# Patient Record
Sex: Female | Born: 1940 | Race: Black or African American | Hispanic: No | Smoking: Current every day smoker
Health system: Southern US, Community
[De-identification: ages and names within clinical notes are randomized; demographics above are authoritative.]

## PROBLEM LIST (undated history)

## (undated) DIAGNOSIS — F0390 Unspecified dementia without behavioral disturbance: Secondary | ICD-10-CM

## (undated) DIAGNOSIS — F32A Depression, unspecified: Secondary | ICD-10-CM

## (undated) DIAGNOSIS — R569 Unspecified convulsions: Secondary | ICD-10-CM

---

## 2005-11-17 ENCOUNTER — Other Ambulatory Visit: Payer: Self-pay

## 2005-11-17 ENCOUNTER — Emergency Department: Payer: Self-pay | Admitting: Unknown Physician Specialty

## 2006-02-09 ENCOUNTER — Ambulatory Visit: Payer: Self-pay | Admitting: Family Medicine

## 2006-03-06 ENCOUNTER — Ambulatory Visit: Payer: Self-pay | Admitting: Family Medicine

## 2007-08-30 ENCOUNTER — Ambulatory Visit: Payer: Self-pay | Admitting: Gastroenterology

## 2007-11-01 ENCOUNTER — Ambulatory Visit: Payer: Self-pay | Admitting: Family Medicine

## 2008-04-22 ENCOUNTER — Ambulatory Visit: Payer: Self-pay | Admitting: Neurology

## 2008-08-29 ENCOUNTER — Ambulatory Visit: Payer: Self-pay | Admitting: Internal Medicine

## 2009-02-02 ENCOUNTER — Ambulatory Visit: Payer: Self-pay | Admitting: Internal Medicine

## 2009-02-11 ENCOUNTER — Ambulatory Visit: Payer: Self-pay | Admitting: Internal Medicine

## 2010-04-01 ENCOUNTER — Ambulatory Visit: Payer: Self-pay | Admitting: Internal Medicine

## 2011-12-27 ENCOUNTER — Ambulatory Visit: Payer: Self-pay | Admitting: Family Medicine

## 2013-11-07 ENCOUNTER — Ambulatory Visit: Payer: Self-pay | Admitting: Family Medicine

## 2014-03-08 ENCOUNTER — Ambulatory Visit: Payer: Self-pay | Admitting: Internal Medicine

## 2014-03-25 ENCOUNTER — Inpatient Hospital Stay: Payer: Self-pay | Admitting: Internal Medicine

## 2014-03-25 LAB — BASIC METABOLIC PANEL
Anion Gap: 18 — ABNORMAL HIGH (ref 7–16)
BUN: 7 mg/dL (ref 7–18)
CHLORIDE: 106 mmol/L (ref 98–107)
CO2: 18 mmol/L — AB (ref 21–32)
CREATININE: 1.67 mg/dL — AB (ref 0.60–1.30)
Calcium, Total: 9.3 mg/dL (ref 8.5–10.1)
EGFR (Non-African Amer.): 30 — ABNORMAL LOW
GFR CALC AF AMER: 35 — AB
Glucose: 113 mg/dL — ABNORMAL HIGH (ref 65–99)
Osmolality: 282 (ref 275–301)
Potassium: 3.5 mmol/L (ref 3.5–5.1)
Sodium: 142 mmol/L (ref 136–145)

## 2014-03-25 LAB — URINALYSIS, COMPLETE
BILIRUBIN, UR: NEGATIVE
Bacteria: NONE SEEN
Glucose,UR: NEGATIVE mg/dL (ref 0–75)
Ketone: NEGATIVE
Leukocyte Esterase: NEGATIVE
Nitrite: NEGATIVE
PH: 6 (ref 4.5–8.0)
Protein: 30
RBC,UR: 3 /HPF (ref 0–5)
SQUAMOUS EPITHELIAL: NONE SEEN
Specific Gravity: 1.008 (ref 1.003–1.030)
WBC UR: 1 /HPF (ref 0–5)

## 2014-03-25 LAB — CBC
HCT: 36.8 % (ref 35.0–47.0)
HGB: 11.9 g/dL — AB (ref 12.0–16.0)
MCH: 32.5 pg (ref 26.0–34.0)
MCHC: 32.3 g/dL (ref 32.0–36.0)
MCV: 101 fL — ABNORMAL HIGH (ref 80–100)
PLATELETS: 194 10*3/uL (ref 150–440)
RBC: 3.66 10*6/uL — ABNORMAL LOW (ref 3.80–5.20)
RDW: 13.5 % (ref 11.5–14.5)
WBC: 10 10*3/uL (ref 3.6–11.0)

## 2014-03-26 ENCOUNTER — Ambulatory Visit: Payer: Self-pay | Admitting: Neurology

## 2014-03-26 LAB — BASIC METABOLIC PANEL
Anion Gap: 10 (ref 7–16)
BUN: 7 mg/dL (ref 7–18)
CALCIUM: 8.8 mg/dL (ref 8.5–10.1)
CHLORIDE: 110 mmol/L — AB (ref 98–107)
CREATININE: 1.2 mg/dL (ref 0.60–1.30)
Co2: 25 mmol/L (ref 21–32)
GFR CALC AF AMER: 52 — AB
GFR CALC NON AF AMER: 45 — AB
Glucose: 104 mg/dL — ABNORMAL HIGH (ref 65–99)
OSMOLALITY: 287 (ref 275–301)
POTASSIUM: 3.5 mmol/L (ref 3.5–5.1)
Sodium: 145 mmol/L (ref 136–145)

## 2014-03-26 LAB — CBC WITH DIFFERENTIAL/PLATELET
BASOS ABS: 0.1 10*3/uL (ref 0.0–0.1)
BASOS PCT: 0.6 %
Eosinophil #: 0.2 10*3/uL (ref 0.0–0.7)
Eosinophil %: 2.1 %
HCT: 34.6 % — ABNORMAL LOW (ref 35.0–47.0)
HGB: 11.8 g/dL — AB (ref 12.0–16.0)
LYMPHS ABS: 3.3 10*3/uL (ref 1.0–3.6)
Lymphocyte %: 36.3 %
MCH: 33.4 pg (ref 26.0–34.0)
MCHC: 34.2 g/dL (ref 32.0–36.0)
MCV: 98 fL (ref 80–100)
MONO ABS: 0.7 x10 3/mm (ref 0.2–0.9)
Monocyte %: 8.1 %
Neutrophil #: 4.8 10*3/uL (ref 1.4–6.5)
Neutrophil %: 52.9 %
Platelet: 203 10*3/uL (ref 150–440)
RBC: 3.54 10*6/uL — ABNORMAL LOW (ref 3.80–5.20)
RDW: 13 % (ref 11.5–14.5)
WBC: 9 10*3/uL (ref 3.6–11.0)

## 2014-03-26 LAB — FOLATE: Folic Acid: 52.6 ng/mL (ref 3.1–100.0)

## 2014-03-26 LAB — AMMONIA: Ammonia, Plasma: 12 mcmol/L (ref 11–32)

## 2014-03-26 LAB — CK: CK, TOTAL: 773 U/L — AB

## 2014-03-28 LAB — ALBUMIN: Albumin: 3.2 g/dL — ABNORMAL LOW (ref 3.4–5.0)

## 2014-03-28 LAB — PHENYTOIN LEVEL, TOTAL: Dilantin: 11 ug/mL (ref 10.0–20.0)

## 2014-04-08 ENCOUNTER — Ambulatory Visit: Payer: Self-pay | Admitting: Internal Medicine

## 2014-11-29 NOTE — Consult Note (Signed)
Psychiatry: Patient seen. Reviewed chart. Also spoke with someone at [facility] who knew the patient's baseline. That person described the patient as normally being able to answer yes/no questions and to walk and feed herself. Clearly demented but not usually agitated and often able to even make appropriate jokes. They had her as dx with dementia and a past history of depression of some sort. On exam today she is awake but will not engage with me. I tried to speak loudly, as I am told she is quite hard of hearing. Patient mostly ignoreed. Me. Nodded to a couple questions and then shouted "I have a headache" but wouldn't say any more. Would not say her name. She was not agitated ant showed no sign of hallucinating. current exam gives very little to go on. Cognitive function seems worse than reported baseline but whether this is volitional or result of brain dysfunction is unknown. She is not aggressive or dangerous or obviously delirious, so I wouldn't suggest adding antipsychotics or tranquikizers unless she gets agitated. Could be depressed but it is not obvious. There is little likely harm in incresing the zoloft to 100mg , so I will do that.  demented and may have reached a new, lower, baseline. Continue aricept and nemenda.  she can walk and feed herself and so is at the same functional levevl as before she can be discharged back to [facility]. If not, and she needs a higher level of care then SW can help with locating that.  not to be more specific, but I'd rather "do no harm" by not adding extra meds unless there is a clearly benifit to be gained.  follow. dementia of mixed eitiology, rule out delirium.  Electronic Signatures: Audery Amellapacs, Lariza Cothron T (MD)  (Signed on 21-Aug-15 14:23)  Authored  Last Updated: 21-Aug-15 14:23 by Audery Amellapacs, Coda Filler T (MD)

## 2014-11-29 NOTE — Consult Note (Signed)
Brief Consult Note: Diagnosis: delirium related to seizures in patient with dementia.   Patient was seen by consultant.   Recommend further assessment or treatment.   Comments: Psychiatry: Patient's chart reviewed. Came to see patient but she was sound asleep and did not arouse to repeatedly and loudly calling her name. I assume and appearance of psychosis can be attributed to ictal or post-ictal state or other delirium in a chronically demented patient. Will order low dose iv haldol and ativan together for agitation. Reevaluate if possible tomorrow.  Electronic Signatures: Audery Amellapacs, Cala Kruckenberg T (MD)  (Signed 20-Aug-15 16:31)  Authored: Brief Consult Note   Last Updated: 20-Aug-15 16:31 by Audery Amellapacs, Payam Gribble T (MD)

## 2014-11-29 NOTE — Consult Note (Signed)
Brief Consult Note: Diagnosis: Cognitive disorder NOS.   Patient was seen by consultant.   Recommend further assessment or treatment.   Comments: Ms. Michele Stout was disoriented and agitated earlier today. She received Ativan and has been fast asleep. Will try to assess tomorow.  Electronic Signatures: Kristine LineaPucilowska, Berkeley Veldman (MD)  (Signed 19-Aug-15 14:41)  Authored: Brief Consult Note   Last Updated: 19-Aug-15 14:41 by Kristine LineaPucilowska, Ameira Alessandrini (MD)

## 2014-11-29 NOTE — Consult Note (Signed)
Referring Physician:  Bettey Costa :   Primary Care Physician:  Marrianne Mood : [phone]  Reason for Consult: Admit Date: 26-Mar-2014  Chief Complaint: Witnessed seizures at group home  Reason for Consult: seizure   History of Present Illness: History of Present Illness:   Ms. Michele Stout is a 74 yo woman with a history of dementia and epilepsy (unknown history and semiology, on levetiracetam) who presented from her group home due to multiple witnessed convulsive events. The patient is a very poor historian and combative, so the history is gathered from the medical record. was apparently in her usual state of health at her group home (where she resides due to her dementia) when she was noted to have 3 separate episodes of bilateral convulsive activity that started with the right arm. EMS was called and she was given midazolam en route. She was admitted for further observation and workup of her seizures. Per RN staff, she has not had any further convulsive activity concerning for seizure since admission, though has remained lethargic and irritable.    ROS:  Review of Systems   Unable to obtain due to combativeness and altered mental status.  Past Medical/Surgical Hx:  seizure:   Past Medical/ Surgical Hx:  Past Medical History epilepsy dementia   Past Surgical History No known surgical history   Home Medications: Medication Instructions Last Modified Date/Time  alendronate 70 mg oral tablet 1 tab(s) orally once a week on Wednesday [id number] 74:25  folic acid 1 mg oral tablet 1 tab(s) orally once a day 18-Aug-15 18:40  Geritol Complete Therapeutic Multiple Vitamins with Minerals oral tablet 1 tab(s) orally once a day 18-Aug-15 18:40  omeprazole 20 mg oral delayed release capsule 1 cap(s) orally once a day 18-Aug-15 18:40  sertraline 50 mg oral tablet 1 tab(s) orally once a day 18-Aug-15 18:40  Calcium 600+D 600 mg-200 units oral tablet 1 tab(s) orally 2 times a day 18-Aug-15 18:40   levETIRAcetam 750 mg oral tablet 1 tab(s) orally 2 times a day 18-Aug-15 18:40  memantine 10 mg oral tablet 1 tab(s) orally 2 times a day 18-Aug-15 18:40  venlafaxine 75 mg oral tablet 1 tab(s) orally 2 times a day 18-Aug-15 18:40  donepezil 10 mg oral tablet 1 tab(s) orally once a day (at bedtime) 18-Aug-15 18:40  acetaminophen 500 mg oral tablet 2 tab(s) orally every 8 hours, As Needed - for Pain 18-Aug-15 18:40  cetirizine 10 mg oral tablet 1 tab(s) orally once a day, As Needed for allergies 18-Aug-15 18:40   Allergies:  No Known Allergies:   Social/Family History: Social History: Lives in group home. Unable to obtain remainder of social history.  Family History: Unable to obtain.   Vital Signs: **Vital Signs.:   19-Aug-15 12:24  Vital Signs Type Routine  Temperature Temperature (F) 98.6  Celsius 37  Temperature Source oral  Pulse Pulse 63  Respirations Respirations 18  Systolic BP Systolic BP 956  Diastolic BP (mmHg) Diastolic BP (mmHg) 65  Mean BP 84  Pulse Ox % Pulse Ox % 98  Pulse Ox Activity Level  At rest  Oxygen Delivery Room Air/ 21 %   EXAM: Extremely irritable upon arousal. No conjunctival injection or scleral edema. Oropharynx clear with tacky oral mucosae. No carotid bruits auscultated. Normal S1, S2 and regular cardiac rhythm on exam. Lungs clear to auscultation bilaterally. Abdomen soft and nontender. Peripheral pulses palpated. No clubbing, cyanosis, or edema in extremities.  MENTAL STATUS: Somnolent, though arousable for exam by touch. Extremely irritable  and frequently tries to bat me away. Mumbles incoherently though language seems to be focused on asking me to leave her alone. CRANIAL NERVES: Unable to examine visual fields as she forcefully maintains her eyes shut. PERRL. No obvious gaze deviation (limited by forced eye closure and noncompliance). Facial muscles grossly symmetric though difficult to examine fully as she refuses to leave a lateral decubitus  position.  MOTOR: Moves all 4 extremities against gravity without gross asymmetry though does not comply with formal strength testing REFLEXES: Extensor plantar responses bilaterally. SENSORY: Briskly localizes to pain in all four extremities. COORDINATION/GAIT: Unable to assess.  Lab Results:  Hepatic:  19-Aug-15 09:27   Albumin, Serum 3.6 (Result(s) reported on 26 Mar 2014 at 10:00AM.)  TDMs:  19-Aug-15 09:27   Dilantin, Serum  24.4 (Result(s) reported on 26 Mar 2014 at 10:03AM.)  Routine Chem:  19-Aug-15 04:20   Glucose, Serum  104  BUN 7  Creatinine (comp) 1.20  Sodium, Serum 145  Potassium, Serum 3.5  Chloride, Serum  110  CO2, Serum 25  Calcium (Total), Serum 8.8  Anion Gap 10  Osmolality (calc) 287  eGFR (African American)  52  eGFR (Non-African American)  45 (eGFR values <16mL/min/1.73 m2 may be an indication of chronic kidney disease (CKD). Calculated eGFR is useful in patients with stable renal function. The eGFR calculation will not be reliable in acutely ill patients when serum creatinine is changing rapidly. It is not useful in  patients on dialysis. The eGFR calculation may not be applicable to patients at the low and high extremes of body sizes, pregnant women, and vegetarians.)    95:28   Folic Acid, Serum 41.3 (Result(s) reported on 26 Mar 2014 at 10:47AM.)  Ammonia, Plasma 12 (Result(s) reported on 26 Mar 2014 at 10:06AM.)  Cardiac:  19-Aug-15 04:20   CK, Total  773 (26-192 NOTE: NEW REFERENCE RANGE  09/09/2013)  Routine UA:  18-Aug-15 18:10   Color (UA) Yellow  Clarity (UA) Clear  Glucose (UA) Negative  Bilirubin (UA) Negative  Ketones (UA) Negative  Specific Gravity (UA) 1.008  Blood (UA) 1+  pH (UA) 6.0  Protein (UA) 30 mg/dL  Nitrite (UA) Negative  Leukocyte Esterase (UA) Negative (Result(s) reported on 25 Mar 2014 at 07:37PM.)  RBC (UA) 3 /HPF  WBC (UA) 1 /HPF  Bacteria (UA) NONE SEEN  Epithelial Cells (UA) NONE SEEN  Mucous (UA)  PRESENT (Result(s) reported on 25 Mar 2014 at 07:37PM.)  Routine Hem:  19-Aug-15 04:20   WBC (CBC) 9.0  RBC (CBC)  3.54  Hemoglobin (CBC)  11.8  Hematocrit (CBC)  34.6  Platelet Count (CBC) 203  MCV 98  MCH 33.4  MCHC 34.2  RDW 13.0  Neutrophil % 52.9  Lymphocyte % 36.3  Monocyte % 8.1  Eosinophil % 2.1  Basophil % 0.6  Neutrophil # 4.8  Lymphocyte # 3.3  Monocyte # 0.7  Eosinophil # 0.2  Basophil # 0.1 (Result(s) reported on 26 Mar 2014 at 05:43AM.)   Radiology Impression: Radiology Impression: Labs and imaging personally reviewed.   Impression/Recommendations: Recommendations:   Ms. Michele Stout is a 74 yo woman with dementia and epilepsy who presents with a cluster of convulsive events concerning for seizure. Her baseline neurologic status and seizure frequency are not known. Currently, her exam is grossly nonfocal though she is somnolent and combatively irritable. Attempts to obtain a head CT have so far been unsuccessful.  etiology of her events is unclear. There is no clear infection to precipitate her seizures.  This may be breakthrough seizures superimposed upon her usual seizure frequency. A new stroke may also cause what seem to be focal-onset seizures by history. Continue levetiracetam. Would not increase dose as this may lead to worsened irritability in an already profoundly irritable patientEEG to rule out nonconvulsive seizure activity as contributor to her poor mental status (severity of her baseline dementia is unknown)Discontinue phenytoin and start Depakote 262m BID insteadHead CT without contrast to evaluate for infarction when/if feasible from behavioral standpoint you for the opportunity to participate in Ms. Menter's care. I will continue to follow along with you. HMar Daring MD   Electronic Signatures: HCarmin Richmond(MD)  (Signed 19-Aug-15 15:04)  Authored: REFERRING PHYSICIAN, Primary Care Physician, Consult, History of Present Illness, Review of Systems, PAST  MEDICAL/SURGICAL HISTORY, HOME MEDICATIONS, ALLERGIES, Social/Family History, NURSING VITAL SIGNS, Physical Exam-, LAB RESULTS, RADIOLOGY RESULTS, Recommendations   Last Updated: 19-Aug-15 15:04 by HCarmin Richmond(MD)

## 2014-11-29 NOTE — Discharge Summary (Signed)
PATIENT NAME:  Michele Stout, Michele Stout MR#:  [id number] DATE OF BIRTH:  06/07/1941  DISCHARGE DIAGNOSES: 1.  Generalized tonic-clonic seizures.  2.  Advanced dementia.  3.  Acute renal failure.  4.  Mild rhabdomyolysis.  5.  Gastroesophageal reflux disease.   CONSULTS: [facility]. Jeryl Columbia, MD, with neurology; Dr. Harvie Junior  with palliative care; Audery Amel, MD, with psychiatry.   IMAGING STUDIES DONE: Include a CT scan of the head without contrast. It showed mild diffuse atrophy, some concern with possible left frontal lobe subarachnoid hemorrhage.  A repeat CT scan in 24 hours showed no subarachnoid hemorrhage.   ADMITTING HISTORY AND PHYSICAL: Please see detailed H and P dictated by Dr. Juliene Pina. In brief, a 74 year old female patient with history of advanced dementia, recurrent seizures, who lives at a group home, presented to the hospital after she was found to have generalized tonic-clonic seizures. The patient also had postictal confusion, admitted to hospitalist service.   HOSPITAL COURSE: 1.  Generalized tonic-clonic seizures. The patient has been on Keppra for a long time which was continued. Initially, started on Dilantin, but later switched to Depakote after discussing with neurology. A CT scan of the head was done which showed mild atrophy. No strokes, but there was some concern for a possible left frontal area subarachnoid hemorrhage, but repeat CT did not show any subarachnoid hemorrhage. I did discuss with Dr. Yetta Barre of neurosurgery initially at  [facility] for a transfer, but he had advised to monitor the patient here at [facility] with which she has done well, and no anticoagulations were given. The patient has been seizure free during the hospital stay. An EEG could not be done as the patient was agitated initially, but she returned to her baseline and there is no concern for any nonconvulsive status epilepticus at this point.  2.  Acute encephalopathy. The patient does have advanced dementia  and with low brain reserve, she did have prolonged confusion after her 3 seizures. The patient by the day of discharge has returned to normal. Initially was started on Seroquel, but this has been stopped after discussing with Dr. Toni Amend, who has seen her. She does seem to have worsening dementia.   The patient worked with physical therapy. She has ambulated well, has returned to normal, and will be discharged back to her group home. If patient has any further confusion or worsening dementia, she might be a candidate to be admitted to a memory care unit.   DISCHARGE MEDICATIONS: 1.  Acetaminophen 500 mg 2 tablets every 8 hours as needed.  2.  Alendronate 70 mg weekly on Wednesdays.  3.  Depakote 250 mg oral b.i.d.  4.  Keppra 750 mg oral b.i.d.  5.  Calcium, vitamin D daily.  7.  Donepezil 10 mg daily. 8.  Folic acid 1 mg daily.  9.  Memantine 10 mg oral 2 times a day.  10.  Omeprazole 20 mg daily.  11.  Sertraline 50 mg oral once a day.  12.  Venlafaxine 75 mg oral 2 times a day.   DISCHARGE INSTRUCTIONS: Regular diet, regular activity, using a walker. Follow up with primary care physician in 1 to 2 weeks and neurology, Dr. Sherryll Burger or Dr. Malvin Johns, in 2 to 4 weeks.   Time Spent on day of discharge in discharge activity was 42 minutes.   ____________________________ Molinda Bailiff Tam Savoia, MD srs:LT D: 03/30/2014 12:08:00 ET T: 03/30/2014 17:59:44 ET JOB#: [id number]  cc: Wardell Heath R. Aryonna Gunnerson, MD, <Dictator> [id number] R  Carnelius Hammitt MD ELECTRONICALLY SIGNED 04/22/2014 16:30

## 2014-11-29 NOTE — H&P (Signed)
PATIENT NAME:  Michele Stout, Michele Stout MR#:  [id number] DATE OF BIRTH:  December 01, 1940  DATE OF ADMISSION:  03/25/2014  PRIMARY CARE PHYSICIAN: Pilar Grammes, MD   CHIEF COMPLAINT: Seizure.   HISTORY OF PRESENT ILLNESS: A 74 year old female with a history of seizure disorder, dementia, who presents from her group home with seizures. Apparently, she had 3 witnessed seizures today at the group home. These started out with the right extremity and then seemed to be evolving to generalized tonic-clonic seizures. EMS was called after her third seizure. She was brought in the EMS truck, received 2 mg of Versed and since then has been groggy. She is unable to obtain a CT scan of the head which was ordered by the ER physician, as the patient  cannot lie still and flat.   REVIEW OF SYSTEMS: Unable to obtain as the patient is lethargic.   PAST MEDICAL HISTORY: As per the medical chart: 1.  Seizure disorder.  2.  Dementia.  3.  GERD. 4.  History of alcohol abuse in the past.  5.  Osteoporosis.  SOCIAL HISTORY: Unable to obtain.  FAMILY HISTORY: Unable to obtain.   PAST SURGICAL HISTORY: Unable to obtain.   MEDICATIONS:  1.  Acetaminophen 500 mg 2 tablets q. 8 hours p.r.n.  2.  Alendronate 70 mg weekly on Wednesday.  3.  Calcium plus D 1 tablet b.i.d.  4.  Cetirizine 10 mg p.r.n.  5.  Donepezil 10 mg at bedtime.  6.  Folic acid 1 mg daily.  7.  Geritol Complete vitamin daily.  8.  Keppra 750 b.i.d.  9.  Memantine 10 mg b.i.d.  10.  Omeprazole 20 mg daily. 11.  Zoloft 50 mg daily.  12.  Effexor 75 mg b.i.d.   ALLERGIES: No known drug allergies.   PHYSICAL EXAMINATION:  VITAL SIGNS: Temperature 99, respirations 22, blood pressure 128/56 and 95% on room air.  GENERAL: The patient is lethargic in a fetal position. She has her covers over her head. When I take the covers off, she actually does move all extremities and kind of wakes up but then puts the covers right back on her head.   HEENT: Head is  atraumatic. Pupils are about 2 mm, sluggish to light. Oropharynx is not inspected as it was difficult to do an oral exam.  NECK: Supple without JVD, carotid bruit, enlarged thyroid. CARDIOVASCULAR: Regular rate and rhythm. No murmurs, gallops or rubs. PMI is not displaced.  LUNGS: Clear to auscultation without crackles, rales, rhonchi or wheezing. Normal to percussion.  ABDOMEN: Bowel sounds are positive. Nontender, nondistended. No hepatosplenomegaly.  EXTREMITIES: No clubbing, cyanosis or edema.  NEUROLOGIC: Hard to do a good neurological exam, but her Babinski sign is downgoing.  SKIN: Without rash or lesions.   LABORATORY DATA: Sodium 142, potassium 3.5, chloride 106, bicarbonate 18, BUN 7, creatinine 1.67, glucose 113, calcium 9.3. Urinalysis is negative for LC or nitrites. White blood cells 10, hemoglobin 12, hematocrit 37, platelets are 194,000.   EKG: Normal sinus rhythm. No ST elevation or depression.   ASSESSMENT AND PLAN: A 74 year old female with dementia, seizure disorder, who presents with 3 seizures.  1.  Seizures. The patient apparently had 3 seizures today which were localized and then went into general tonic-clonic seizures. The patient was given 2 mg of Versed by EMS and now is postictal on top of probably some dementia. I have ordered a neurological consult, neurological checks q. 4 hours, EEG. We will try to obtain a CT of  the head. I have placed her on Dilantin 100 mg IV q. 8 hours after fosphenytoin loading which the pharmacy will assist with.  Hold p.o. medications for now.  2.  Dementia. Once she is able to take in p.o. she may resume her p.o. medications.  3.  Acute kidney injury, likely secondary to these seizures with possible component of acute tubular necrosis. We will provide IV fluids. Repeat a BMP in the a.m.   CODE STATUS: The patient is a full code status.  Case management has been consulted for discharge planning.   TIME SPENT: Approximately 40  minutes.   ____________________________ Janyth ContesSital P. Juliene PinaMody, MD spm:TT D: 03/25/2014 20:05:59 ET T: 03/25/2014 20:48:09 ET JOB#: [id number]  cc: Tammy Wickliffe P. Juliene PinaMody, MD, <Dictator> Janyth ContesSITAL P Zakary Kimura MD ELECTRONICALLY SIGNED 03/26/2014 13:49

## 2014-12-24 ENCOUNTER — Other Ambulatory Visit: Payer: Self-pay | Admitting: Internal Medicine

## 2014-12-24 DIAGNOSIS — Z1231 Encounter for screening mammogram for malignant neoplasm of breast: Secondary | ICD-10-CM

## 2015-01-08 ENCOUNTER — Other Ambulatory Visit: Payer: Self-pay | Admitting: Internal Medicine

## 2015-01-08 ENCOUNTER — Ambulatory Visit
Admission: RE | Admit: 2015-01-08 | Discharge: 2015-01-08 | Disposition: A | Discharge status: Home / Self Care | Payer: Medicare Other | Source: Ambulatory Visit | Attending: Internal Medicine | Admitting: Internal Medicine

## 2015-01-08 DIAGNOSIS — R922 Inconclusive mammogram: Secondary | ICD-10-CM | POA: Diagnosis not present

## 2015-01-08 DIAGNOSIS — Z1231 Encounter for screening mammogram for malignant neoplasm of breast: Secondary | ICD-10-CM

## 2015-01-09 ENCOUNTER — Other Ambulatory Visit: Payer: Self-pay | Admitting: Internal Medicine

## 2015-01-15 ENCOUNTER — Other Ambulatory Visit: Payer: Self-pay | Admitting: Internal Medicine

## 2015-01-15 DIAGNOSIS — E2839 Other primary ovarian failure: Secondary | ICD-10-CM

## 2015-01-16 ENCOUNTER — Other Ambulatory Visit: Payer: Self-pay | Admitting: Internal Medicine

## 2015-01-16 DIAGNOSIS — R928 Other abnormal and inconclusive findings on diagnostic imaging of breast: Secondary | ICD-10-CM

## 2015-01-26 ENCOUNTER — Ambulatory Visit
Admission: RE | Admit: 2015-01-26 | Discharge: 2015-01-26 | Disposition: A | Discharge status: Home / Self Care | Payer: Medicare Other | Source: Ambulatory Visit | Attending: Internal Medicine | Admitting: Internal Medicine

## 2015-01-26 DIAGNOSIS — R928 Other abnormal and inconclusive findings on diagnostic imaging of breast: Secondary | ICD-10-CM | POA: Diagnosis present

## 2015-01-26 DIAGNOSIS — M8580 Other specified disorders of bone density and structure, unspecified site: Secondary | ICD-10-CM | POA: Diagnosis not present

## 2015-01-26 DIAGNOSIS — E2839 Other primary ovarian failure: Secondary | ICD-10-CM

## 2015-02-18 ENCOUNTER — Emergency Department
Admission: EM | Admit: 2015-02-18 | Discharge: 2015-02-18 | Disposition: A | Discharge status: Home / Self Care | Payer: Medicare Other | Attending: Emergency Medicine | Admitting: Emergency Medicine

## 2015-02-18 ENCOUNTER — Emergency Department: Payer: Medicare Other

## 2015-02-18 DIAGNOSIS — M25552 Pain in left hip: Secondary | ICD-10-CM | POA: Diagnosis present

## 2015-02-18 NOTE — ED Provider Notes (Signed)
[facility] Emergency Department Provider Note    ____________________________________________  Time seen: On EMS arrival  I have reviewed the triage vital signs and the nursing notes.   HISTORY  Chief Complaint No chief complaint on file.   History limited by: Dementia   HPI Michele Stout is a 74 y.o. female who presents from assisted living facility today because of concerns for left hip pain. Staff told EMS that the patient started complaining of left hip pain today. No known falls. The patient is at her mental baseline according to EMS staff. The patient herself is not complaining of any pain when asked. However the patient is also not very verbal.     No past medical history on file.  There are no active problems to display for this patient.   No past surgical history on file.  No current outpatient prescriptions on file.  Allergies Review of patient's allergies indicates not on file.  No family history on file.  Social History History  Substance Use Topics  . Smoking status: Not on file  . Smokeless tobacco: Not on file  . Alcohol Use: Not on file   lives at assisted living facility  Review of Systems Unable to obtain secondary to dementia, nonverbal. ____________________________________________   PHYSICAL EXAM:  VITAL SIGNS:   98.6 F (37 C)   56  17  126/89 mmHg  97 %   Constitutional: Awake and alert, in no acute distress. Eyes: Conjunctivae are normal. PERRL. Normal extraocular movements. ENT   Head: Normocephalic and atraumatic.   Nose: No congestion/rhinnorhea.   Mouth/Throat: Mucous membranes are moist.   Neck: No stridor. Hematological/Lymphatic/Immunilogical: No cervical lymphadenopathy. Cardiovascular: Normal rate, regular rhythm.  No murmurs, rubs, or gallops. Respiratory: Normal respiratory effort without tachypnea nor retractions. Breath sounds are clear and equal bilaterally. No  wheezes/rales/rhonchi. Gastrointestinal: Soft and nontender. No distention. There is no CVA tenderness. Genitourinary: Deferred Musculoskeletal: Normal range of motion in all extremities. No joint effusions.  Some tenderness over the left hip. No shortening. No rotation. No tenderness on rotation at the hip or flexion of the hip. Neurologic:  Awake and alert, nonverbal Skin:  Skin is warm, dry and intact. No rash noted.   ____________________________________________    LABS (pertinent positives/negatives)  None  ____________________________________________   EKG  None  ____________________________________________    RADIOLOGY  Left hip x-ray IMPRESSION: Slight symmetric narrowing of both hip joints. No fracture or dislocation.  ____________________________________________   PROCEDURES  Procedure(s) performed: None  Critical Care performed: No  ____________________________________________   INITIAL IMPRESSION / ASSESSMENT AND PLAN / ED COURSE  Pertinent labs & imaging results that were available during my care of the patient were reviewed by me and considered in my medical decision making (see chart for details).  Patient here with concerns for left hip pain. No known trauma. X-ray without obvious hip fracture. Will discharge back to living facility.  ____________________________________________   FINAL CLINICAL IMPRESSION(S) / ED DIAGNOSES  Final diagnoses:  Hip pain, left     Phineas SemenGraydon Corinthian Mizrahi, MD 02/18/15 1504

## 2015-02-18 NOTE — Discharge Instructions (Signed)
Please seek medical attention for any high fevers, chest pain, shortness of breath, change in behavior, persistent vomiting, bloody stool or any other new or concerning symptoms. °Arthritis, Nonspecific °Arthritis is inflammation of a joint. This usually means pain, redness, warmth or swelling are present. One or more joints may be involved. There are a number of types of arthritis. Your caregiver may not be able to tell what type of arthritis you have right away. °CAUSES  °The most common cause of arthritis is the wear and tear on the joint (osteoarthritis). This causes damage to the cartilage, which can break down over time. The knees, hips, back and neck are most often affected by this type of arthritis. °Other types of arthritis and common causes of joint pain include: °· Sprains and other injuries near the joint. Sometimes minor sprains and injuries cause pain and swelling that develop hours later. °· Rheumatoid arthritis. This affects hands, feet and knees. It usually affects both sides of your body at the same time. It is often associated with chronic ailments, fever, weight loss and general weakness. °· Crystal arthritis. Gout and pseudo gout can cause occasional acute severe pain, redness and swelling in the foot, ankle, or knee. °· Infectious arthritis. Bacteria can get into a joint through a break in overlying skin. This can cause infection of the joint. Bacteria and viruses can also spread through the blood and affect your joints. °· Drug, infectious and allergy reactions. Sometimes joints can become mildly painful and slightly swollen with these types of illnesses. °SYMPTOMS  °· Pain is the main symptom. °· Your joint or joints can also be red, swollen and warm or hot to the touch. °· You may have a fever with certain types of arthritis, or even feel overall ill. °· The joint with arthritis will hurt with movement. Stiffness is present with some types of arthritis. °DIAGNOSIS  °Your caregiver will  suspect arthritis based on your description of your symptoms and on your exam. Testing may be needed to find the type of arthritis: °· Blood and sometimes urine tests. °· X-ray tests and sometimes CT or MRI scans. °· Removal of fluid from the joint (arthrocentesis) is done to check for bacteria, crystals or other causes. Your caregiver (or a specialist) will numb the area over the joint with a local anesthetic, and use a needle to remove joint fluid for examination. This procedure is only minimally uncomfortable. °· Even with these tests, your caregiver may not be able to tell what kind of arthritis you have. Consultation with a specialist (rheumatologist) may be helpful. °TREATMENT  °Your caregiver will discuss with you treatment specific to your type of arthritis. If the specific type cannot be determined, then the following general recommendations may apply. °Treatment of severe joint pain includes: °· Rest. °· Elevation. °· Anti-inflammatory medication (for example, ibuprofen) may be prescribed. Avoiding activities that cause increased pain. °· Only take over-the-counter or prescription medicines for pain and discomfort as recommended by your caregiver. °· Cold packs over an inflamed joint may be used for 10 to 15 minutes every hour. Hot packs sometimes feel better, but do not use overnight. Do not use hot packs if you are diabetic without your caregiver's permission. °· A cortisone shot into arthritic joints may help reduce pain and swelling. °· Any acute arthritis that gets worse over the next 1 to 2 days needs to be looked at to be sure there is no joint infection. °Long-term arthritis treatment involves modifying activities and lifestyle   to reduce joint stress jarring. This can include weight loss. Also, exercise is needed to nourish the joint cartilage and remove waste. This helps keep the muscles around the joint strong. °HOME CARE INSTRUCTIONS  °· Do not take aspirin to relieve pain if gout is suspected.  This elevates uric acid levels. °· Only take over-the-counter or prescription medicines for pain, discomfort or fever as directed by your caregiver. °· Rest the joint as much as possible. °· If your joint is swollen, keep it elevated. °· Use crutches if the painful joint is in your leg. °· Drinking plenty of fluids may help for certain types of arthritis. °· Follow your caregiver's dietary instructions. °· Try low-impact exercise such as: °¨ Swimming. °¨ Water aerobics. °¨ Biking. °¨ Walking. °· Morning stiffness is often relieved by a warm shower. °· Put your joints through regular range-of-motion. °SEEK MEDICAL CARE IF:  °· You do not feel better in 24 hours or are getting worse. °· You have side effects to medications, or are not getting better with treatment. °SEEK IMMEDIATE MEDICAL CARE IF:  °· You have a fever. °· You develop severe joint pain, swelling or redness. °· Many joints are involved and become painful and swollen. °· There is severe back pain and/or leg weakness. °· You have loss of bowel or bladder control. °Document Released: 09/01/2004 Document Revised: 10/17/2011 Document Reviewed: 09/17/2008 °[employer]® Patient Information ©2015 [employer]. This information is not intended to replace advice given to you by your health care provider. Make sure you discuss any questions you have with your health care provider. ° °

## 2015-02-18 NOTE — ED Notes (Signed)
Patient arrives via [facility] with c/o left hip pain. No trauma associated with pain per staff of [facility].

## 2015-02-18 NOTE — ED Notes (Signed)
Report called to [facility] Patient stable at time of TX. [facility] arrival to transport patient

## 2015-08-03 IMAGING — CR DG HIP (WITH OR WITHOUT PELVIS) 2-3V*L*
3 series · 3 of 3 positions shown · non-contrast
Comparison: None.

CLINICAL DATA: Left hip pain.  No history of recent trauma

EXAM:
DG HIP (WITH OR WITHOUT PELVIS) 2-3V LEFT

[pelvis ap]
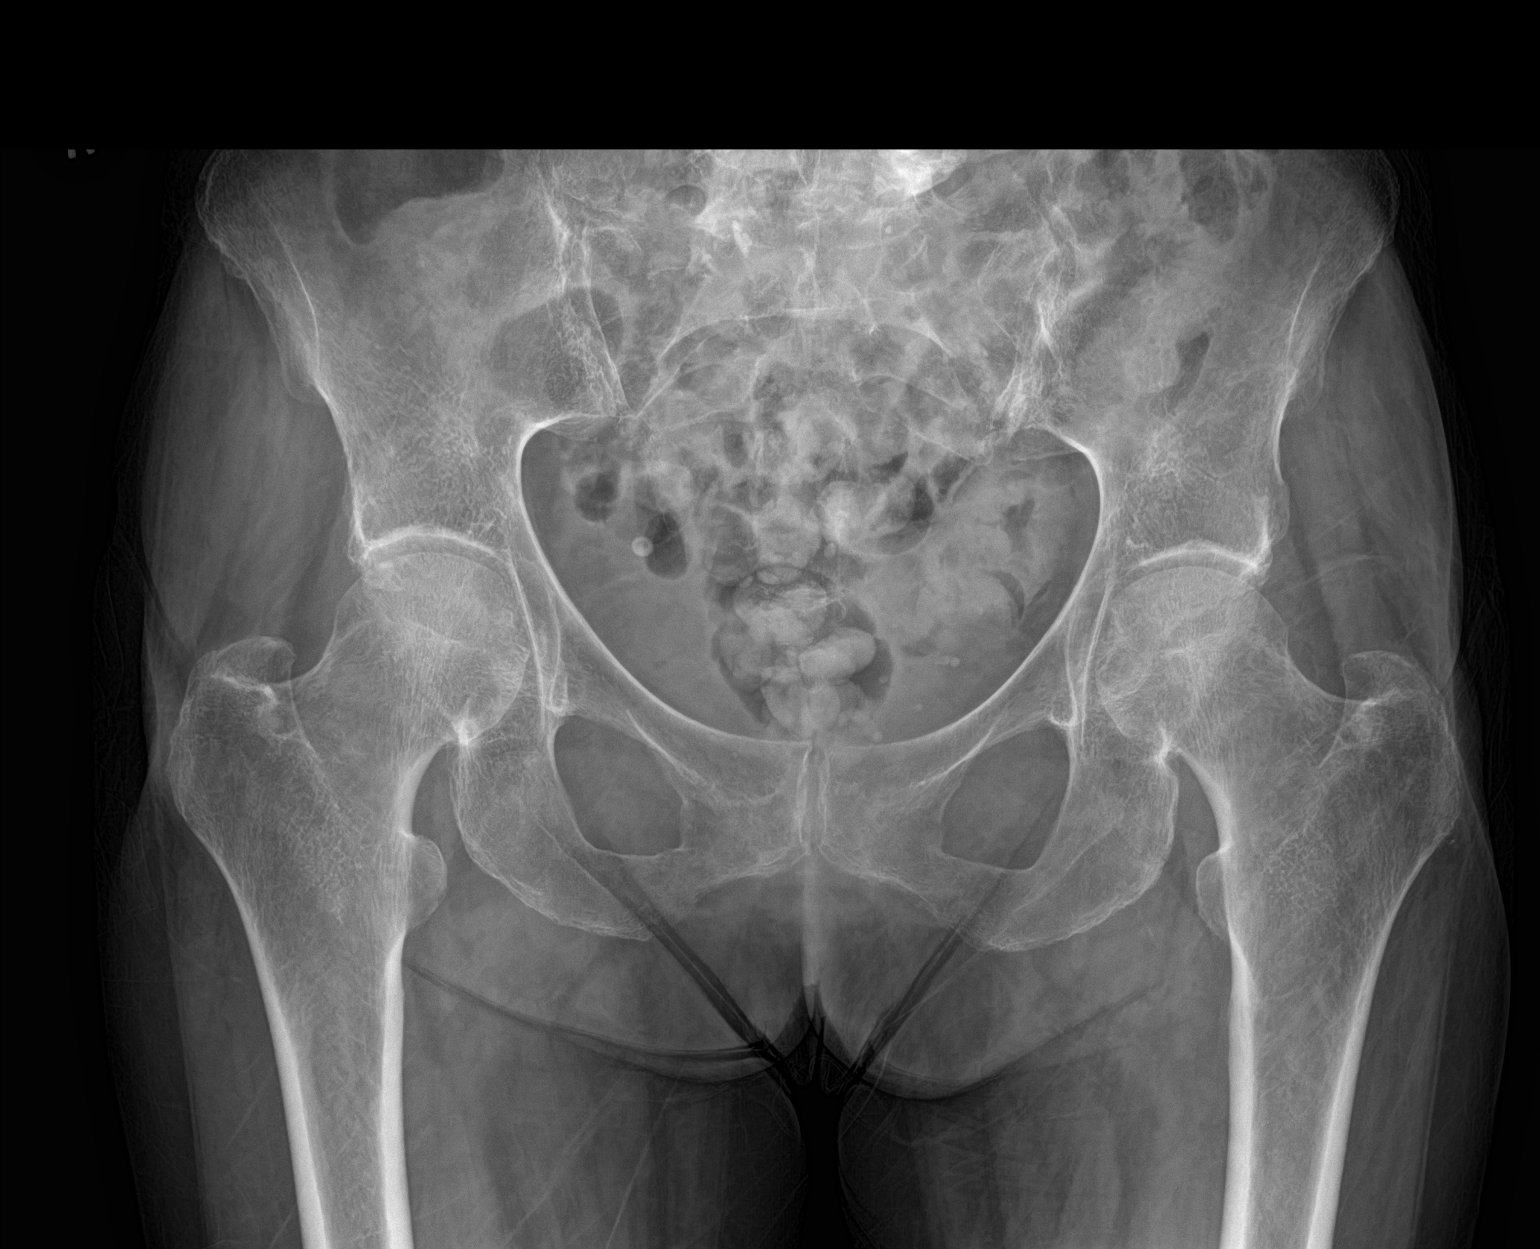

[hip ap]
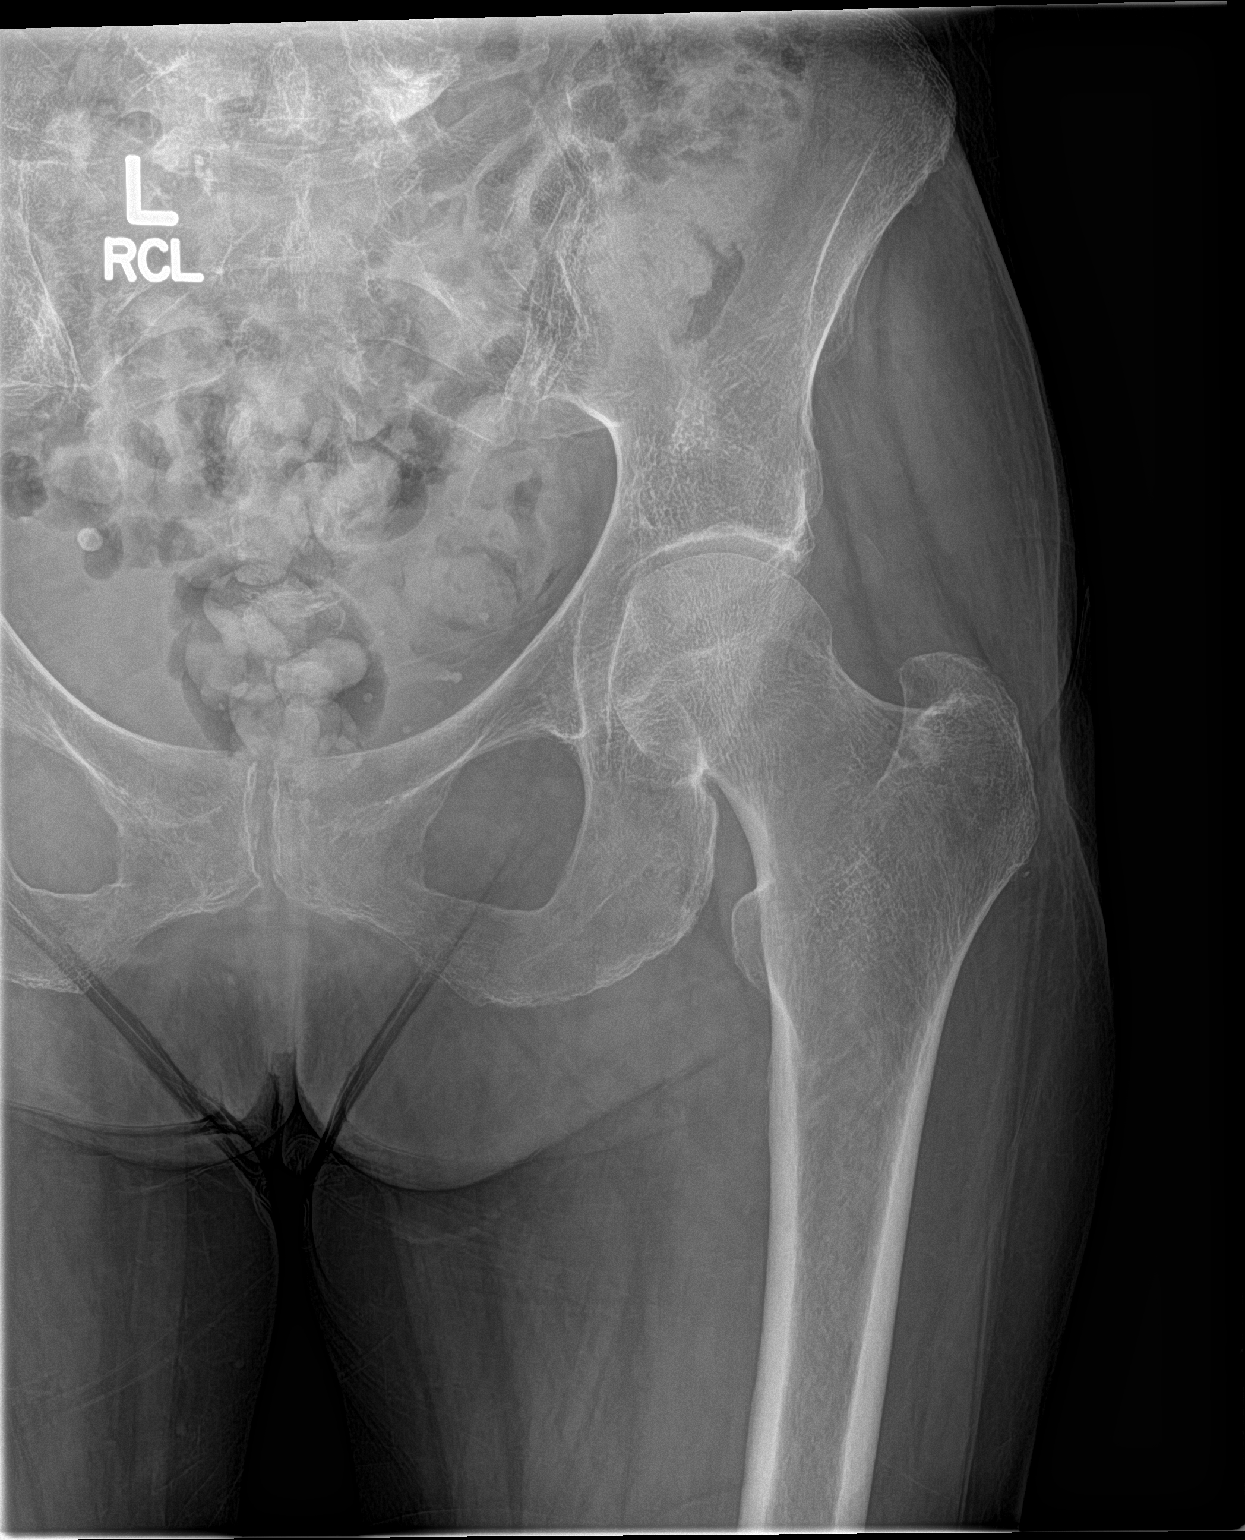

[hip lat]
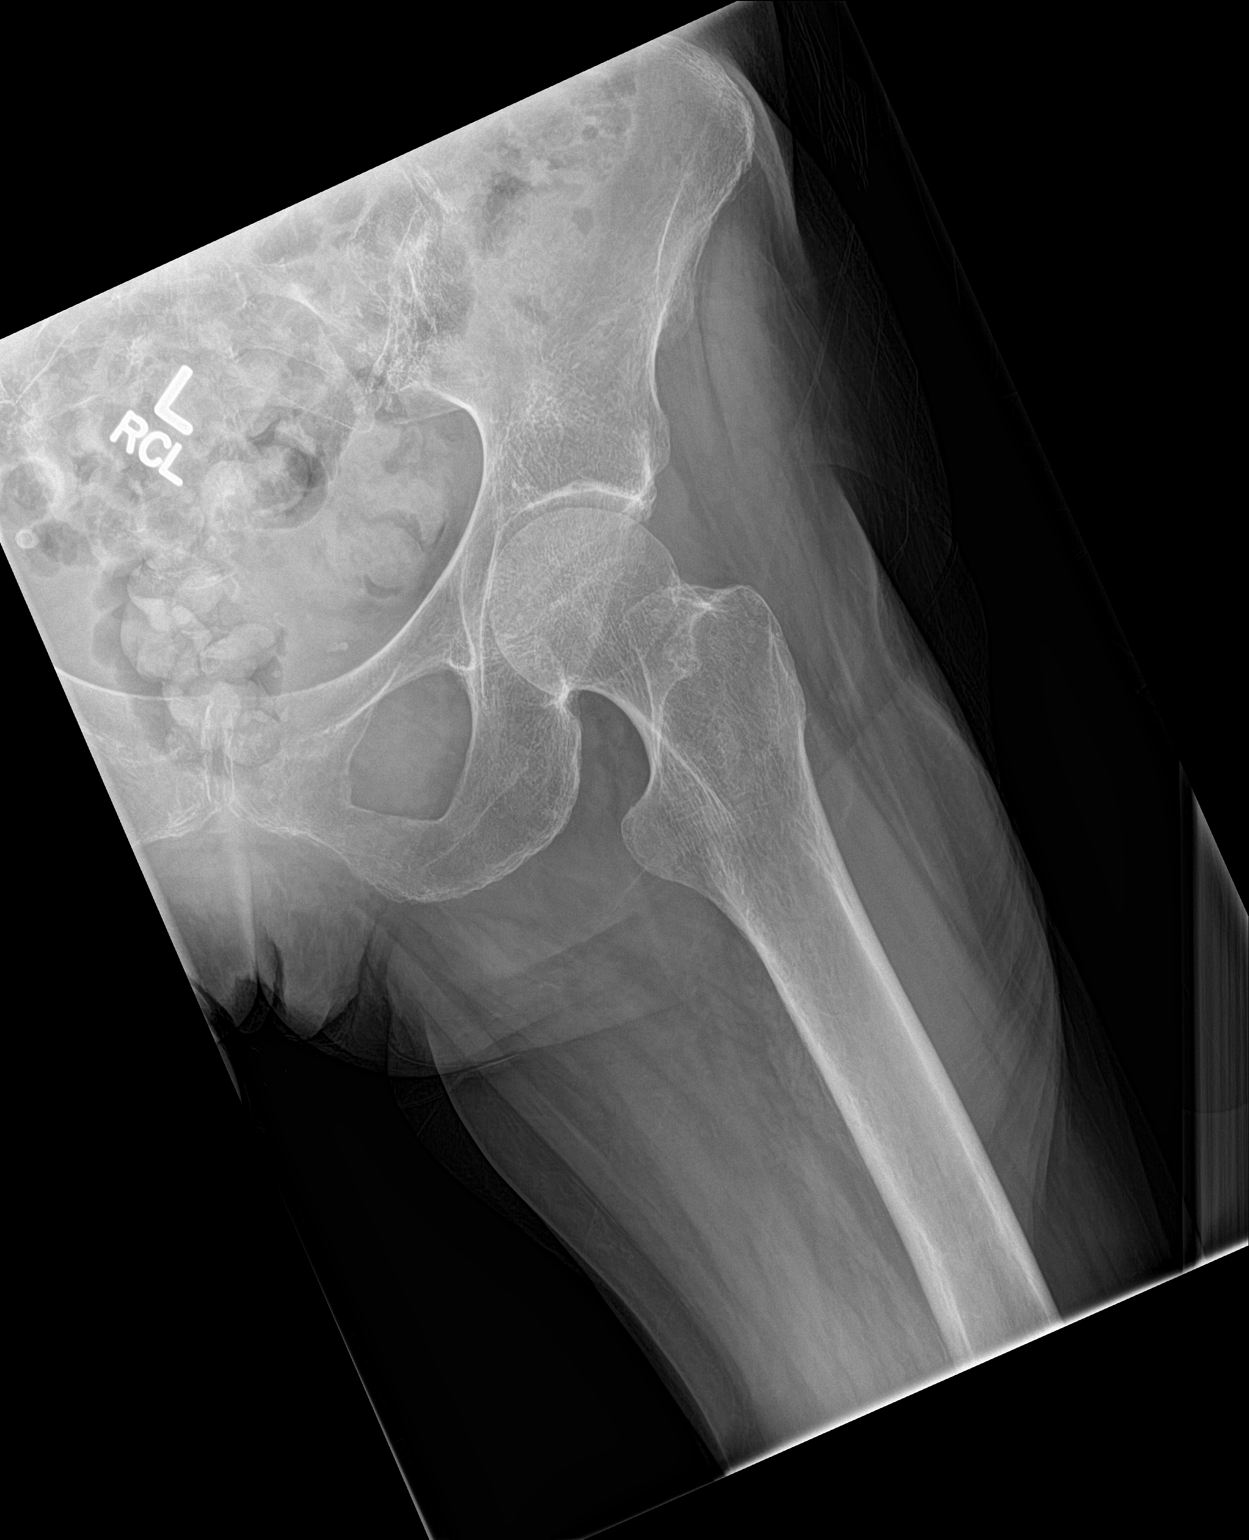

[3 of 3 positions shown; findings below may reference images not displayed]

FINDINGS: Frontal pelvis as well as frontal and lateral left hip images were
obtained. There is no fracture or dislocation. There is slight
symmetric narrowing of both hip joints. No erosive change.
IMPRESSION: Slight symmetric narrowing of both hip joints. No fracture or
dislocation.

## 2019-12-29 ENCOUNTER — Emergency Department
Admission: EM | Admit: 2019-12-29 | Discharge: 2019-12-30 | Disposition: A | Discharge status: Home / Self Care | Payer: Medicare Other | Attending: Emergency Medicine | Admitting: Emergency Medicine

## 2019-12-29 ENCOUNTER — Other Ambulatory Visit: Payer: Self-pay

## 2019-12-29 ENCOUNTER — Emergency Department: Payer: Medicare Other

## 2019-12-29 DIAGNOSIS — Y939 Activity, unspecified: Secondary | ICD-10-CM | POA: Insufficient documentation

## 2019-12-29 DIAGNOSIS — W010XXA Fall on same level from slipping, tripping and stumbling without subsequent striking against object, initial encounter: Secondary | ICD-10-CM | POA: Insufficient documentation

## 2019-12-29 DIAGNOSIS — Y92129 Unspecified place in nursing home as the place of occurrence of the external cause: Secondary | ICD-10-CM | POA: Diagnosis not present

## 2019-12-29 DIAGNOSIS — M545 Low back pain, unspecified: Secondary | ICD-10-CM

## 2019-12-29 DIAGNOSIS — Y999 Unspecified external cause status: Secondary | ICD-10-CM | POA: Diagnosis not present

## 2019-12-29 DIAGNOSIS — W19XXXA Unspecified fall, initial encounter: Secondary | ICD-10-CM

## 2019-12-29 MED ORDER — ACETAMINOPHEN 325 MG PO TABS: 650.0000 mg | ORAL_TABLET | Freq: Once | ORAL | Status: DC

## 2019-12-29 NOTE — ED Notes (Signed)
Pt to xray

## 2019-12-29 NOTE — ED Triage Notes (Signed)
Ems states pt from [facility]. Per ems pt slipped on floor and fell. Pt with history of dementia, just moans when touched. Per ems pt with tenderness on palpation of back. No drainage noted from ears or nose. Pupils 35mm.

## 2019-12-29 NOTE — ED Provider Notes (Signed)
[facility] Emergency Department Provider Note   ____________________________________________   First MD Initiated Contact with Patient 12/29/19 2310     (approximate)  I have reviewed the triage vital signs and the nursing notes.   HISTORY  Chief Complaint Fall  Level V caveat: Limited by dementia  HPI Michele Stout is a 79 y.o. female brought to the ED via EMS from assisted living facility status post fall.  EMS reports staff witnessed patient slipping on wet floor and fell.  Denies LOC.  Patient with a history of dementia who moans when her back is touched.  Rest of history is limited secondary to dementia.     Past medical history Dementia GERD Seizures  There are no problems to display for this patient.   Prior to Admission medications   Medication Sig Start Date End Date Taking? Authorizing Provider  acetaminophen (TYLENOL) 500 MG tablet Take 2 tablets by mouth every 8 (eight) hours as needed for mild pain.     [provider]  alendronate (FOSAMAX) 70 MG tablet Take 1 tablet by mouth every 7 (seven) days. On Wednesday    [provider]  Calcium Carbonate-Vitamin D (CALCIUM 600+D) 600-400 MG-UNIT per tablet Take 1 tablet by mouth 2 (two) times daily.    [provider]  cetirizine (ZYRTEC) 10 MG tablet Take 1 tablet by mouth daily as needed for allergies.     [provider]  donepezil (ARICEPT) 10 MG tablet Take 1 tablet by mouth at bedtime.    [provider]  folic acid (FOLVITE) 1 MG tablet Take 1 tablet by mouth daily.    [provider]  Iron-Vitamins (GERITOL COMPLETE) TABS Take 1 tablet by mouth daily.    [provider]  levETIRAcetam (KEPPRA) 1000 MG tablet Take 1 tablet by mouth 2 (two) times daily.    [provider]  memantine (NAMENDA) 10 MG tablet Take 1 tablet by mouth 2 (two) times daily.    [provider]  omeprazole (PRILOSEC) 20 MG capsule  Take 1 capsule by mouth daily.    [provider]  sertraline (ZOLOFT) 50 MG tablet Take 1 tablet by mouth daily.    [provider]  venlafaxine (EFFEXOR) 75 MG tablet Take 1 tablet by mouth 2 (two) times daily.    [provider]    Allergies Aspirin  No family history on file.  Social History Social History   Tobacco Use   Smoking status: Not on file  Substance Use Topics   Alcohol use: Not on file   Drug use: Not on file    Review of Systems  Constitutional: No fever/chills Eyes: No visual changes. ENT: No sore throat. Cardiovascular: Denies chest pain. Respiratory: Denies shortness of breath. Gastrointestinal: No abdominal pain.  No nausea, no vomiting.  No diarrhea.  No constipation. Genitourinary: Negative for dysuria. Musculoskeletal: Positive for back pain. Skin: Negative for rash. Neurological: Negative for headaches, focal weakness or numbness.   ____________________________________________   PHYSICAL EXAM:  VITAL SIGNS: ED Triage Vitals [12/29/19 2310]  Enc Vitals Group     BP 124/73     Pulse Rate 81     Resp (!) 24     Temp 97.7 F (36.5 C)     Temp Source Oral     SpO2 98 %     Weight 110 lb (49.9 kg)     Height 5\' 2"  (1.575 m)     Head Circumference  Peak Flow      Pain Score      Pain Loc      Pain Edu?      Excl. in [place]?     Constitutional: Alert and oriented.  Elderly appearing and in no acute distress. Eyes: Conjunctivae are normal. PERRL. EOMI. Head: Atraumatic. Nose: Atraumatic. Mouth/Throat: Mucous membranes are moist.  No dental malocclusion. Neck: No stridor.  No cervical spine tenderness to palpation.  No step-offs or deformities noted. Cardiovascular: Normal rate, regular rhythm. Grossly normal heart sounds.  Good peripheral circulation. Respiratory: Normal respiratory effort.  No retractions. Lungs CTAB. Gastrointestinal: Soft and nontender to light or deep palpation. No distention. No  abdominal bruits. No CVA tenderness. Musculoskeletal: Lumbar spine tender to palpation.  No step-offs or deformities noted.  Pelvis stable.  No lower extremity tenderness nor edema.  No joint effusions. Neurologic: Poor verbal output which is patient's baseline according to her neurology notes.  No gross focal neurologic deficits are appreciated. MAEx4. Skin:  Skin is warm, dry and intact. No rash noted. Psychiatric: Mood and affect are normal. Speech and behavior are normal.  ____________________________________________   LABS (all labs ordered are listed, but only abnormal results are displayed)  Labs Reviewed  CBC WITH DIFFERENTIAL/PLATELET - Abnormal; Notable for the following components:      Result Value   WBC 14.2 (*)    Neutro Abs 9.7 (*)    Abs Immature Granulocytes 0.13 (*)    All other components within normal limits  URINALYSIS, COMPLETE (UACMP) WITH MICROSCOPIC - Abnormal; Notable for the following components:   Color, Urine YELLOW (*)    APPearance CLEAR (*)    Hgb urine dipstick SMALL (*)    All other components within normal limits  COMPREHENSIVE METABOLIC PANEL - Abnormal; Notable for the following components:   Glucose, Bld 111 (*)    Creatinine, Ser 1.30 (*)    GFR calc non Af Amer 40 (*)    GFR calc Af Amer 46 (*)    All other components within normal limits  TROPONIN I (HIGH SENSITIVITY)   ____________________________________________  EKG  ED ECG REPORT I, Zylee Marchiano J, the attending physician, personally viewed and interpreted this ECG.   Date: 12/29/2019  EKG Time: 2337  Rate: 73  Rhythm: normal EKG, normal sinus rhythm  Axis: LAD  Intervals:none  ST&T Change: Nonspecific  ____________________________________________  RADIOLOGY  ED MD interpretation: No ICH, pelvis without fracture, lumbar degenerative changes  Official radiology report(s): DG Lumbar Spine Complete  Result Date: 12/30/2019 CLINICAL DATA:  Fall, back pain EXAM: LUMBAR SPINE  - COMPLETE 4+ VIEW COMPARISON:  None. FINDINGS: There are 5 lumbar type vertebral bodies. Dextrocurvature of the thoracic spine and levocurvature of the lumbar spine. The osseous structures appear diffusely demineralized which may limit detection of small or nondisplaced fractures. There is notable vertebral body height loss at the T8 vertebral level, not well visualized on lateral radiograph. There is slight anterior wedging at the L5 vertebral body in the lumbar spine though this may be on a degenerative basis given the extensive discogenic changes most severe at L3-L5. Posterior facet arthropathy is noted more prevalent through the lumbar levels as well. No spondylolisthesis or clear spondylolysis is evident. No worrisome osseous lesions. Extensive atherosclerotic calcification in the abdominal aorta. Normal bowel gas pattern. Lung bases and mediastinal contours are unremarkable. IMPRESSION: 1. Vertebral body height loss at the T8 vertebral level, not well visualized on lateral radiograph. Correlate for point tenderness and consider dedicated thoracic  assessment which would be best performed with cross-sectional imaging. 2. Slight anterior wedging of the L5 vertebral body in the lumbar spine may be on a degenerative basis. Correlate for point tenderness and consider additional imaging of the lumbar levels as well if present. 3. Extensive discogenic and facet arthropathy. Electronically Signed   By: Kreg Shropshire M.D.   On: 12/30/2019 00:13   DG Pelvis 1-2 Views  Result Date: 12/30/2019 CLINICAL DATA:  Fall EXAM: PELVIS - 1-2 VIEW COMPARISON:  Concurrent lumbar radiographs FINDINGS: The osseous structures appear diffusely demineralized which may limit detection of small or nondisplaced fractures. No acute pelvic fracture or diastasis. Proximal femora are intact and normally located. Degenerative changes in the lumbar spine are better detailed on dedicated radiographs. Additional degenerative arthrosis noted at  the SI joints and bilateral hips as well as at the pubic symphysis where there is mild chondrocalcinosis which could suggest calcium pyrophosphate deposition. Scattered phleboliths in the pelvis. Soft tissues are unremarkable. IMPRESSION: 1. No acute pelvic fracture or diastasis. 2. Degenerative changes in the lumbar spine, pelvis and hips. Electronically Signed   By: Kreg Shropshire M.D.   On: 12/30/2019 00:14   CT Head Wo Contrast  Result Date: 12/30/2019 CLINICAL DATA:  Fall EXAM: CT HEAD WITHOUT CONTRAST TECHNIQUE: Contiguous axial images were obtained from the base of the skull through the vertex without intravenous contrast. COMPARISON:  CT head 03/27/2014 FINDINGS: Brain: Motion degraded images may limit detection of subtle anomaly. No convincing CT evidence of acute infarction, hemorrhage, hydrocephalus, extra-axial collection or mass lesion/mass effect. Symmetric prominence of the ventricles, cisterns and sulci compatible with parenchymal volume loss. Patchy areas of white matter hypoattenuation are most compatible with chronic microvascular angiopathy. Vascular: Atherosclerotic calcification of the carotid siphons. No hyperdense vessel. Skull: Small nondisplaced fractures may be obscured by motion artifact. Small right frontal/supraorbital scalp swelling. No definite calvarial or visible facial bone fracture. Mild hyperostosis frontalis interna is similar to comparison. No worrisome osseous lesions. Sinuses/Orbits: Paranasal sinuses and mastoid air cells are predominantly clear. Poor assessment secondary to motion of orbital structures though no gross abnormality is seen. Other: None IMPRESSION: Motion degraded images may limit detection of subtle anomalies. Mild right frontal/supraorbital soft tissue swelling. No definite acute intracranial abnormality. 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 Electronically Signed   By: Kreg Shropshire M.D.   On: 12/30/2019  00:28    ____________________________________________   PROCEDURES  Procedure(s) performed (including Critical Care):  Procedures   ____________________________________________   INITIAL IMPRESSION / ASSESSMENT AND PLAN / ED COURSE  As part of my medical decision making, I reviewed the following data within the electronic MEDICAL RECORD NUMBER Nursing notes reviewed and incorporated, Labs reviewed, EKG interpreted, Old chart reviewed, Radiograph reviewed and Notes from prior ED visits     KYNSLEIGH WESTENDORF was evaluated in Emergency Department on 12/30/2019 for the symptoms described in the history of present illness. She was evaluated in the context of the global COVID-19 pandemic, which necessitated consideration that the patient might be at risk for infection with the SARS-CoV-2 virus that causes COVID-19. Institutional protocols and algorithms that pertain to the evaluation of patients at risk for COVID-19 are in a state of rapid change based on information released by regulatory bodies including the CDC and federal and state organizations. These policies and algorithms were followed during the patient's care in the ED.    79 year old female with dementia who presents with back pain status post mechanical  fall.  Differential diagnosis includes but is not limited to compression fractures, vertebral fracture, musculoskeletal injury, etc.  Will obtain x-ray imaging studies including CT head, basic lab work.  Administer Tylenol for pain.  Of note, patient was noted to be sitting up in no acute distress.   Clinical Course as of Dec 29 345  Mon Dec 30, 2019  0214 Patient resting no acute distress.  Delay secondary to lab work.  Awaiting results.   [JS]  0254 Rest of lab work results unremarkable.  Patient sleeping in no acute distress.  Will discharge back to care home.     [JS]    Clinical Course User Index [JS] Irean Hong, MD      ____________________________________________   FINAL CLINICAL IMPRESSION(S) / ED DIAGNOSES  Final diagnoses:  Fall, initial encounter  Lumbar back pain     ED Discharge Orders    None       Note:  This document was prepared using Dragon voice recognition software and may include unintentional dictation errors.   Irean Hong, MD 12/30/19 [phone]

## 2019-12-30 ENCOUNTER — Emergency Department: Payer: Medicare Other

## 2019-12-30 ENCOUNTER — Encounter: Payer: Self-pay | Admitting: Radiology

## 2019-12-30 DIAGNOSIS — M545 Low back pain: Secondary | ICD-10-CM | POA: Diagnosis not present

## 2019-12-30 LAB — COMPREHENSIVE METABOLIC PANEL
ALT: 23 U/L (ref 0–44)
AST: 35 U/L (ref 15–41)
Albumin: 3.7 g/dL (ref 3.5–5.0)
Alkaline Phosphatase: 65 U/L (ref 38–126)
Anion gap: 10 (ref 5–15)
BUN: 16 mg/dL (ref 8–23)
CO2: 25 mmol/L (ref 22–32)
Calcium: 9.8 mg/dL (ref 8.9–10.3)
Chloride: 107 mmol/L (ref 98–111)
Creatinine, Ser: 1.3 mg/dL — ABNORMAL HIGH (ref 0.44–1.00)
GFR calc Af Amer: 46 mL/min — ABNORMAL LOW (ref >60)
GFR calc non Af Amer: 40 mL/min — ABNORMAL LOW (ref >60)
Glucose, Bld: 111 mg/dL — ABNORMAL HIGH (ref 70–99)
Potassium: 3.6 mmol/L (ref 3.5–5.1)
Sodium: 142 mmol/L (ref 135–145)
Total Bilirubin: 0.7 mg/dL (ref 0.3–1.2)
Total Protein: 7.5 g/dL (ref 6.5–8.1)

## 2019-12-30 LAB — CBC WITH DIFFERENTIAL/PLATELET
Abs Immature Granulocytes: 0.13 10*3/uL — ABNORMAL HIGH (ref 0.00–0.07)
Basophils Absolute: 0.1 10*3/uL (ref 0.0–0.1)
Basophils Relative: 1 %
Eosinophils Absolute: 0.4 10*3/uL (ref 0.0–0.5)
Eosinophils Relative: 3 %
HCT: 38.3 % (ref 36.0–46.0)
Hemoglobin: 12.8 g/dL (ref 12.0–15.0)
Immature Granulocytes: 1 %
Lymphocytes Relative: 21 %
Lymphs Abs: 3 10*3/uL (ref 0.7–4.0)
MCH: 31.8 pg (ref 26.0–34.0)
MCHC: 33.4 g/dL (ref 30.0–36.0)
MCV: 95.3 fL (ref 80.0–100.0)
Monocytes Absolute: 0.9 10*3/uL (ref 0.1–1.0)
Monocytes Relative: 6 %
Neutro Abs: 9.7 10*3/uL — ABNORMAL HIGH (ref 1.7–7.7)
Neutrophils Relative %: 68 %
Platelets: 229 10*3/uL (ref 150–400)
RBC: 4.02 MIL/uL (ref 3.87–5.11)
RDW: 13.1 % (ref 11.5–15.5)
WBC: 14.2 10*3/uL — ABNORMAL HIGH (ref 4.0–10.5)
nRBC: 0 % (ref 0.0–0.2)

## 2019-12-30 LAB — URINALYSIS, COMPLETE (UACMP) WITH MICROSCOPIC
Bacteria, UA: NONE SEEN
Bilirubin Urine: NEGATIVE
Glucose, UA: NEGATIVE mg/dL
Ketones, ur: NEGATIVE mg/dL
Leukocytes,Ua: NEGATIVE
Nitrite: NEGATIVE
Protein, ur: NEGATIVE mg/dL
Specific Gravity, Urine: 1.012 (ref 1.005–1.030)
Squamous Epithelial / HPF: NONE SEEN (ref 0–5)
pH: 5 (ref 5.0–8.0)

## 2019-12-30 LAB — TROPONIN I (HIGH SENSITIVITY): Troponin I (High Sensitivity): 9 ng/L (ref <18)

## 2019-12-30 MED ORDER — FENTANYL CITRATE (PF) 100 MCG/2ML IJ SOLN
25.0000 ug | Freq: Once | INTRAMUSCULAR | Status: AC
  Administered 2019-12-30: 25 ug via INTRAVENOUS
  Filled 2019-12-30: qty 2

## 2019-12-30 NOTE — Discharge Instructions (Addendum)
You may take Tylenol as needed for discomfort.  Return to the ER for worsening symptoms, persistent vomiting, difficulty breathing or other concerns. 

## 2019-12-30 NOTE — ED Notes (Signed)
Pt in bed with bed alarm on, pt calm at present. Lab called for recollect, lab to draw due to pt being difficult stick.

## 2019-12-30 NOTE — ED Notes (Signed)
Transport called into [facility].

## 2019-12-30 NOTE — ED Notes (Signed)
Attempt to call report to [facility] without answer.

## 2020-06-13 IMAGING — CT CT HEAD W/O CM
3 series · 16 of 47 positions shown, 19 images · non-contrast
Comparison: CT head 03/27/2014

CLINICAL DATA: Fall

EXAM:
CT HEAD WITHOUT CONTRAST
TECHNIQUE: Contiguous axial images were obtained from the base of the skull
through the vertex without intravenous contrast.

[Series 4: head wo · axial · 0.42mm/px · z∈[-117,+8]mm · 10 of 30 slices shown, 13 images]
[im 3/30  brain]
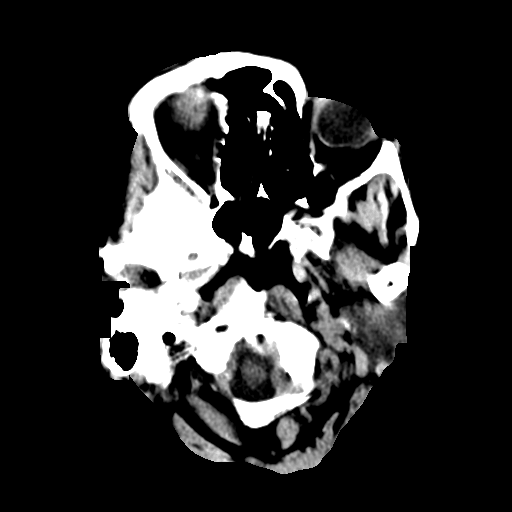
[im 3/30  bone]
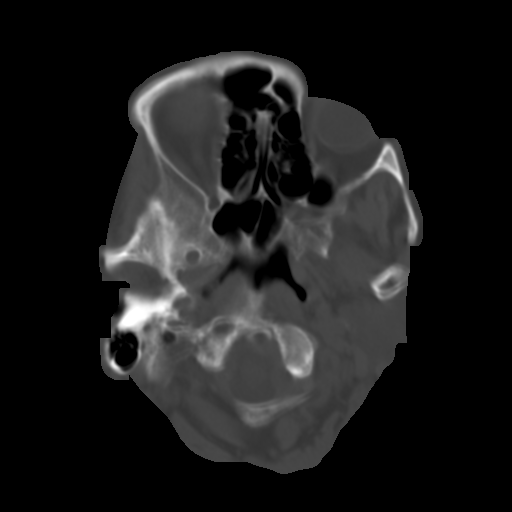
[im 6/30  brain]
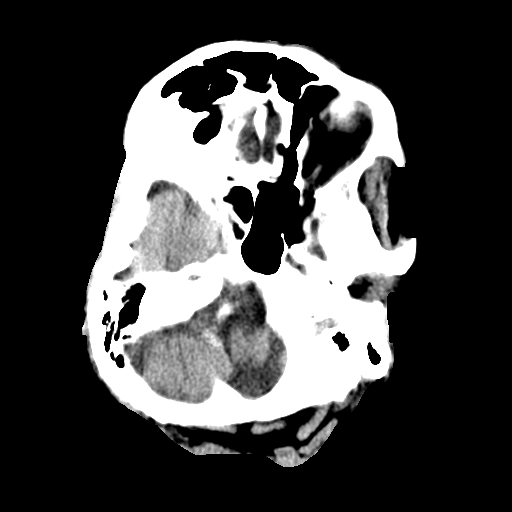
[im 9/30  brain]
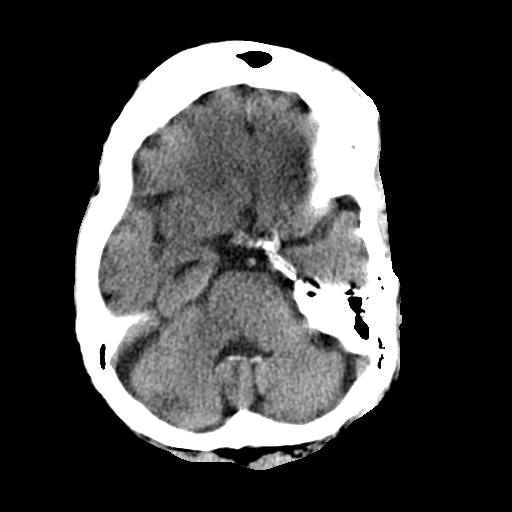
[im 11/30  brain]
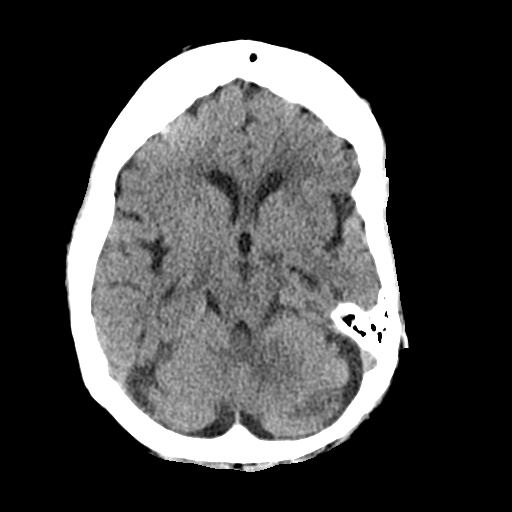
[im 14/30  brain]
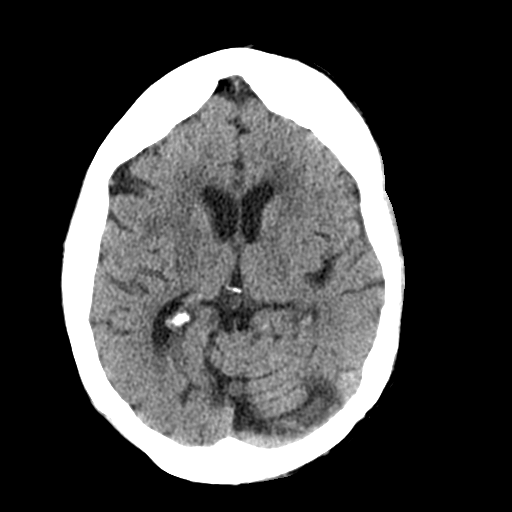
[im 14/30  bone]
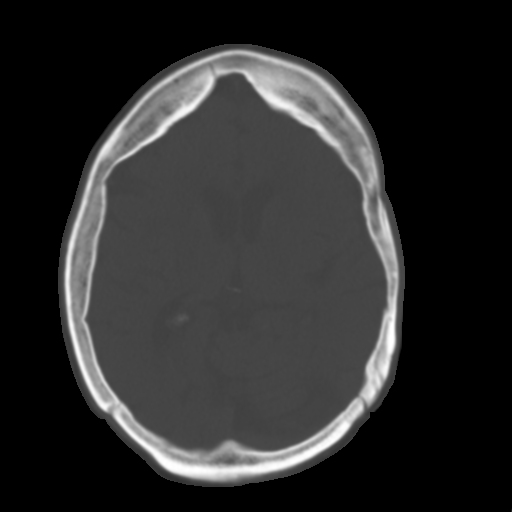
[im 17/30  brain]
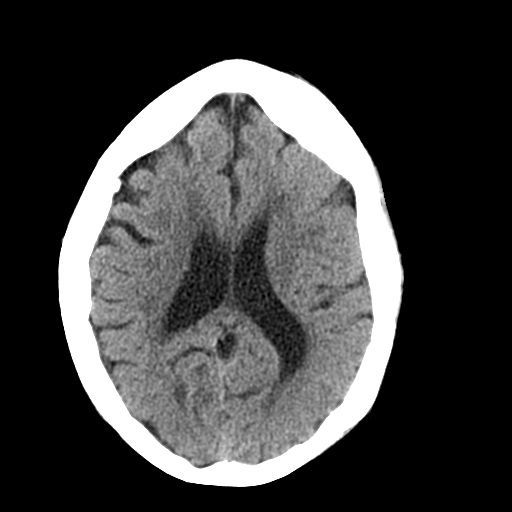
[im 20/30  brain]
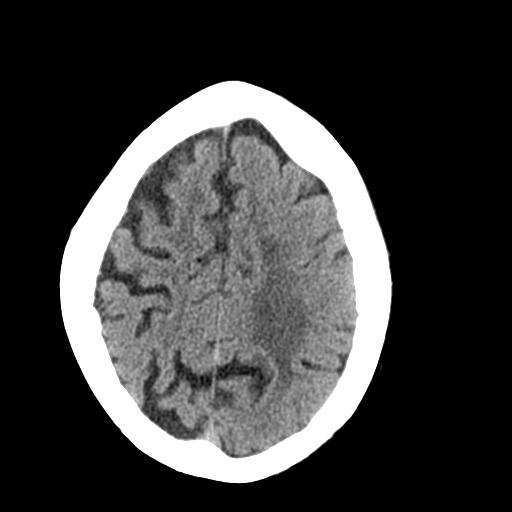
[im 23/30  brain]
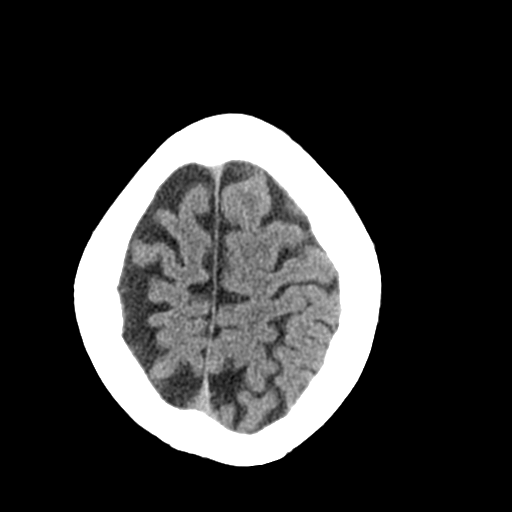
[im 25/30  brain]
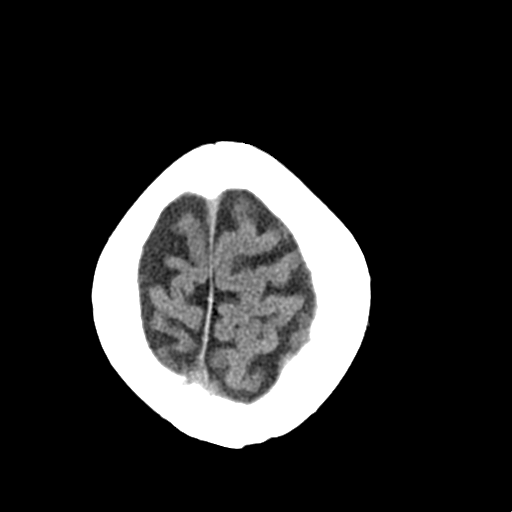
[im 25/30  bone]
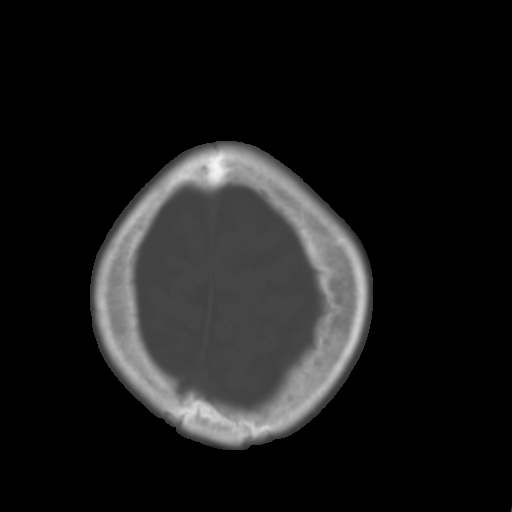
[im 28/30  brain]
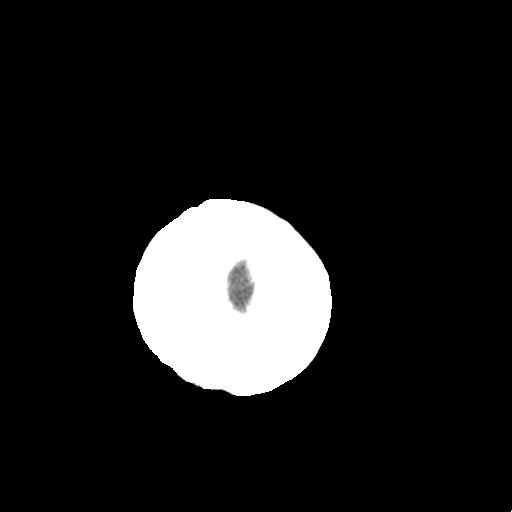

[Series 5: coronal soft tissue · coronal · 0.32mm/px · 3 of 65 slices shown]
[im 22/65  brain]
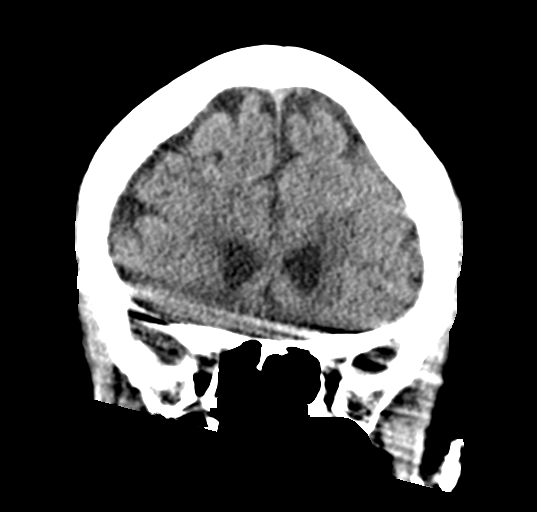
[im 29/65  brain]
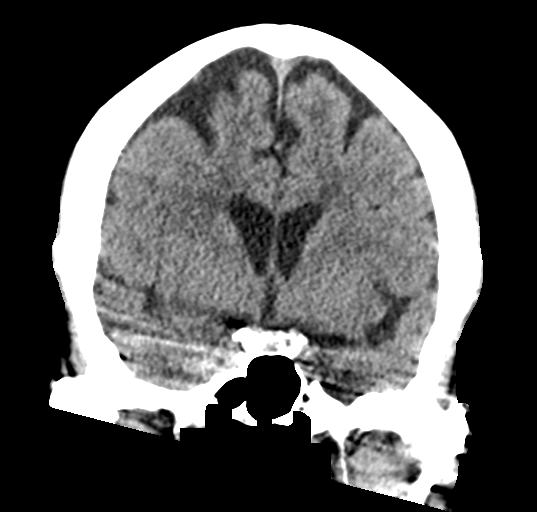
[im 36/65  brain]
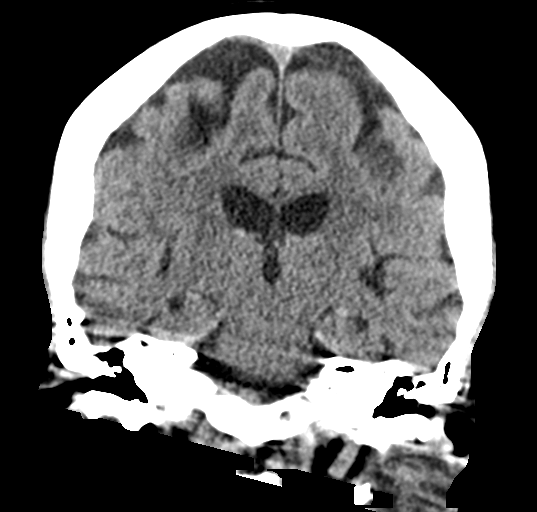

[Series 6: sagittal soft tissue · sagittal · 0.35mm/px · 3 of 48 slices shown]
[im 20/48  brain]
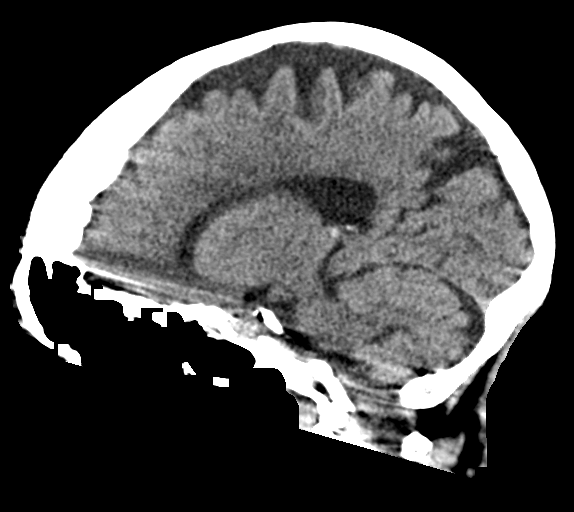
[im 24/48  brain]
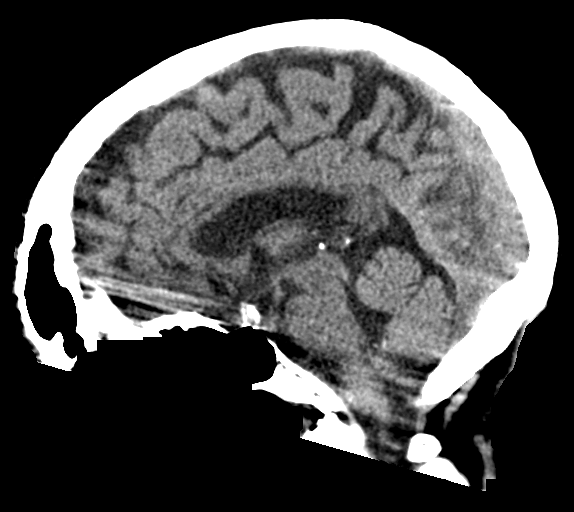
[im 29/48  brain]
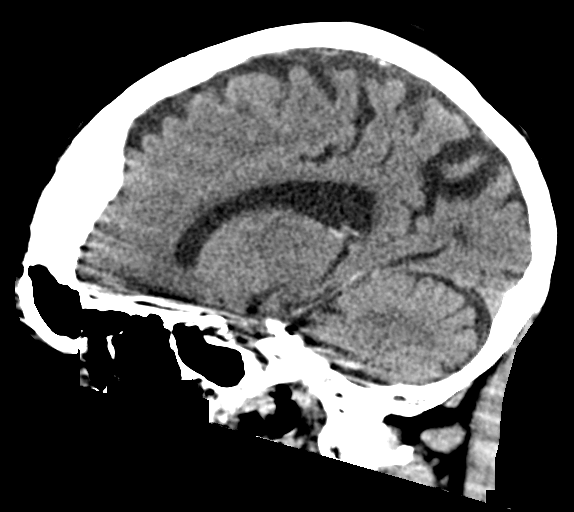

[16 of 47 positions shown; findings below may reference images not displayed]

FINDINGS: Brain: Motion degraded images may limit detection of subtle anomaly.
No convincing CT evidence of acute infarction, hemorrhage,
hydrocephalus, extra-axial collection or mass lesion/mass effect.
Symmetric prominence of the ventricles, cisterns and sulci
compatible with parenchymal volume loss. Patchy areas of white
matter hypoattenuation are most compatible with chronic
microvascular angiopathy.

Vascular: Atherosclerotic calcification of the carotid siphons. No
hyperdense vessel.

Skull: Small nondisplaced fractures may be obscured by motion
artifact. Small right frontal/supraorbital scalp swelling. No
definite calvarial or visible facial bone fracture. Mild
hyperostosis frontalis interna is similar to comparison. No
worrisome osseous lesions.

Sinuses/Orbits: Paranasal sinuses and mastoid air cells are
predominantly clear. Poor assessment secondary to motion of orbital
structures though no gross abnormality is seen.

Other: None
IMPRESSION: Motion degraded images may limit detection of subtle anomalies.

Mild right frontal/supraorbital soft tissue swelling. No definite
acute intracranial abnormality.

Chronic parenchymal volume loss and microvascular angiopathy is
grossly similar to prior.

## 2023-10-02 ENCOUNTER — Other Ambulatory Visit: Payer: Self-pay

## 2023-10-02 ENCOUNTER — Emergency Department
Admission: EM | Admit: 2023-10-02 | Discharge: 2023-10-03 | Disposition: A | Discharge status: Home / Self Care | Payer: Medicare Other | Attending: Emergency Medicine | Admitting: Emergency Medicine

## 2023-10-02 ENCOUNTER — Emergency Department: Payer: Medicare Other

## 2023-10-02 DIAGNOSIS — M545 Low back pain, unspecified: Secondary | ICD-10-CM | POA: Diagnosis present

## 2023-10-02 DIAGNOSIS — F0390 Unspecified dementia without behavioral disturbance: Secondary | ICD-10-CM | POA: Insufficient documentation

## 2023-10-02 DIAGNOSIS — W19XXXA Unspecified fall, initial encounter: Secondary | ICD-10-CM

## 2023-10-02 DIAGNOSIS — W1839XA Other fall on same level, initial encounter: Secondary | ICD-10-CM | POA: Diagnosis not present

## 2023-10-02 HISTORY — DX: Depression, unspecified: F32.A

## 2023-10-02 HISTORY — DX: Unspecified convulsions: R56.9

## 2023-10-02 HISTORY — DX: Unspecified dementia, unspecified severity, without behavioral disturbance, psychotic disturbance, mood disturbance, and anxiety: F03.90

## 2023-10-02 LAB — COMPREHENSIVE METABOLIC PANEL
ALT: 14 U/L (ref 0–44)
AST: 28 U/L (ref 15–41)
Albumin: 2.7 g/dL — ABNORMAL LOW (ref 3.5–5.0)
Alkaline Phosphatase: 71 U/L (ref 38–126)
Anion gap: 11 (ref 5–15)
BUN: 18 mg/dL (ref 8–23)
CO2: 26 mmol/L (ref 22–32)
Calcium: 9.5 mg/dL (ref 8.9–10.3)
Chloride: 101 mmol/L (ref 98–111)
Creatinine, Ser: 0.81 mg/dL (ref 0.44–1.00)
GFR, Estimated: >60 mL/min (ref >60)
Glucose, Bld: 98 mg/dL (ref 70–99)
Potassium: 3.3 mmol/L — ABNORMAL LOW (ref 3.5–5.1)
Sodium: 138 mmol/L (ref 135–145)
Total Bilirubin: 0.4 mg/dL (ref 0.0–1.2)
Total Protein: 7.2 g/dL (ref 6.5–8.1)

## 2023-10-02 LAB — CBC
HCT: 32.3 % — ABNORMAL LOW (ref 36.0–46.0)
Hemoglobin: 10.6 g/dL — ABNORMAL LOW (ref 12.0–15.0)
MCH: 31.3 pg (ref 26.0–34.0)
MCHC: 32.8 g/dL (ref 30.0–36.0)
MCV: 95.3 fL (ref 80.0–100.0)
Platelets: 326 10*3/uL (ref 150–400)
RBC: 3.39 MIL/uL — ABNORMAL LOW (ref 3.87–5.11)
RDW: 13.9 % (ref 11.5–15.5)
WBC: 7.7 10*3/uL (ref 4.0–10.5)
nRBC: 0 % (ref 0.0–0.2)

## 2023-10-02 MED ORDER — LIDOCAINE 5 % EX PTCH
1.0000 | MEDICATED_PATCH | CUTANEOUS | Status: DC
  Filled 2023-10-02: qty 1

## 2023-10-02 NOTE — ED Provider Notes (Signed)
 [facility] Emergency Department Provider Note     Event Date/Time   First MD Initiated Contact with Patient 10/02/23 1615     (approximate)   History   Fall   HPI  Michele Stout is a 83 y.o. female brought to the ED via EMS from assisted living facility following a fall on Saturday and now complaining of lower back pain.  Patient has a history of dementia and is nonverbal at baseline.  Poor historian.  Patient is alone in ED.  Patient moans when touched on her lower back and left side rib cage.  Unable to know if patient hit her head or lost consciousness.      Physical Exam   Triage Vital Signs: ED Triage Vitals [10/02/23 1551]  Encounter Vitals Group     BP (!) 145/88     Systolic BP Percentile      Diastolic BP Percentile      Pulse Rate 65     Resp 18     Temp 97.8 F (36.6 C)     Temp Source Oral     SpO2 100 %     Weight      Height      Head Circumference      Peak Flow      Pain Score      Pain Loc      Pain Education      Exclude from Growth Chart     Most recent vital signs: Vitals:   10/02/23 1551  BP: (!) 145/88  Pulse: 65  Resp: 18  Temp: 97.8 F (36.6 C)  SpO2: 100%    General Awake, no distress.  Grunting is baseline.  HEENT NCAT. PERRL. EOMI. No rhinorrhea. Mucous membranes are moist.  No postauricular ecchymosis or battle signs. CV:  Good peripheral perfusion.  RRR.  No ecchymosis however patient grunts and guards when left rib cage is palpated.  No step-offs or crepitus noted. RESP:  Normal effort.  LCTAB.  No retractions ABD:  No distention.  Soft.  Nontender. BACK:  Spinous process is midline without. Extensive kyphosis of thoracic spine. Tenderness with palpation to lumbar region.  NEURO:  No apparent focal deficits.   ED Results / Procedures / Treatments   Labs (all labs ordered are listed, but only abnormal results are displayed) Labs Reviewed  CBC - Abnormal; Notable for the following components:       Result Value   RBC 3.39 (*)    Hemoglobin 10.6 (*)    HCT 32.3 (*)    All other components within normal limits  COMPREHENSIVE METABOLIC PANEL - Abnormal; Notable for the following components:   Potassium 3.3 (*)    Albumin 2.7 (*)    All other components within normal limits    RADIOLOGY I personally viewed and evaluated these images as part of my medical decision making, as well as reviewing the written report by the radiologist.  DG Chest Port 1 View Result Date: 10/02/2023 CLINICAL DATA:  Fall EXAM: PORTABLE CHEST 1 VIEW COMPARISON:  01/25/2009 FINDINGS: Masslike left upper lobe mass. Right lung is clear. No pleural effusion or pneumothorax. The heart is normal in size. Mid thoracic dextroscoliosis. IMPRESSION: Masslike left upper lobe mass, suspicious for primary bronchogenic carcinoma. Chest CT with contrast is suggested for further evaluation. Electronically Signed   By: Charline Bills M.D.   On: 10/02/2023 19:15   DG HIPS BILAT WITH PELVIS MIN 5 VIEWS Result Date:  10/02/2023 CLINICAL DATA:  Fall, low back pain EXAM: DG HIP (WITH OR WITHOUT PELVIS) 5+V BILAT COMPARISON:  None Available. FINDINGS: No fracture or dislocation is seen. Bilateral hip joint spaces are preserved Visualized bony pelvis appears intact. Degenerative changes of the lower lumbar spine. IMPRESSION: Negative. Electronically Signed   By: Charline Bills M.D.   On: 10/02/2023 19:14   CT Lumbar Spine Wo Contrast Result Date: 10/02/2023 CLINICAL DATA:  Fall, low back pain EXAM: CT LUMBAR SPINE WITHOUT CONTRAST TECHNIQUE: Multidetector CT imaging of the lumbar spine was performed without intravenous contrast administration. Multiplanar CT image reconstructions were also generated. RADIATION DOSE REDUCTION: This exam was performed according to the departmental dose-optimization program which includes automated exposure control, adjustment of the mA and/or kV according to patient size and/or use of iterative  reconstruction technique. COMPARISON:  None Available. FINDINGS: Segmentation: 5 lumbar type vertebral bodies. Alignment: Normal lumbar lordosis. Vertebrae: Mild superior endplate compression fracture deformity at L1, with 15% loss of height and minimal retropulsion (sagittal image 68), age indeterminate but favored to be acute. Paraspinal and other soft tissues: Mild linear scarring in the left lower lobe. Vascular calcifications. Moderate colonic stool burden. Disc levels: Mild to moderate multilevel degenerative changes, most prominent L3-4 and L4-5. Associated mild narrowing of the spinal canal at those levels. IMPRESSION: Mild superior endplate compression fracture deformity at L1, age indeterminate but favored to be acute. Electronically Signed   By: Charline Bills M.D.   On: 10/02/2023 19:13   CT Head Wo Contrast Result Date: 10/02/2023 CLINICAL DATA:  Head trauma EXAM: CT HEAD WITHOUT CONTRAST TECHNIQUE: Contiguous axial images were obtained from the base of the skull through the vertex without intravenous contrast. RADIATION DOSE REDUCTION: This exam was performed according to the departmental dose-optimization program which includes automated exposure control, adjustment of the mA and/or kV according to patient size and/or use of iterative reconstruction technique. COMPARISON:  Head CT 12/30/2019 FINDINGS: Brain: No evidence of acute infarction, hemorrhage, hydrocephalus, extra-axial collection or mass lesion/mass effect. There is mild diffuse atrophy. Vascular: Atherosclerotic calcifications are present within the cavernous internal carotid arteries. Skull: Normal. Negative for fracture or focal lesion. Sinuses/Orbits: No acute finding. Other: None. IMPRESSION: 1. No acute intracranial process. 2. Mild diffuse atrophy. Electronically Signed   By: Darliss Cheney M.D.   On: 10/02/2023 19:10    PROCEDURES:  Critical Care performed: No  Procedures   MEDICATIONS ORDERED IN ED: Medications   lidocaine (LIDODERM) 5 % 1 patch (has no administration in time range)     IMPRESSION / MDM / ASSESSMENT AND PLAN / ED COURSE  I reviewed the triage vital signs and the nursing notes.                                 82 y.o. female presents to the emergency department for evaluation and treatment of lower back pain following a fall. See HPI for further details.   Differential diagnosis includes, but is not limited to compression fracture, dislocation, flail chest, musculoskeletal injury  Patient's presentation is most consistent with acute presentation with potential threat to life or bodily function.  Chart reviewed.  Incidental finding of possible masslike left upper lobe mass suspicious for primary bronchogenic carcinoma per radiologist.  Referral to nodule pulmonary clinic ordered.  Lumbar CT scan revealed a possible compression fracture of the L1 vertebrae.  Unknown chronicity but suspect to be acute.  Patient is advised bedrest  and physical therapy with limited physical activity for optimal healing.  This is all verbalized to nursing staff by RN.  Patient is in stable condition for discharge home at this time with outpatient management.  Nursing facility contacted.  FINAL CLINICAL IMPRESSION(S) / ED DIAGNOSES   Final diagnoses:  Fall, initial encounter  Lumbar back pain   Rx / DC Orders   ED Discharge Orders          Ordered    AMB  Referral to Pulmonary Nodule Clinic       Comments: Lung mass needs close follow up   10/02/23 1927             Note:  This document was prepared using Dragon voice recognition software and may include unintentional dictation errors.    Romeo Apple, Shanyia Stines A, PA-C 10/02/23 2327    Sharyn Creamer, MD 10/02/23 [phone]

## 2023-10-02 NOTE — ED Notes (Addendum)
 Pt appears to be non-verbal. [facility] to speak to staff and ask if this was her baseline. They stated that she was very hard of hearing and you have to speak loudly for her to be able to hear you. They also stated that she occasionally answers verbally but more often nods or shakes her head. This indicates that she is currently at her baseline. The number that reaches her care givers at the facility is [phone].

## 2023-10-02 NOTE — Discharge Instructions (Addendum)
 Your evaluated in the ED for fall and lower back pain.  An incidental finding of a possible lung mass was discovered in which we have put a referral for a pulmonary nodule clinic for further evaluation.  Your lumbar CT scan revealed a possible compression fracture of the L1 vertebrae.  You are advised bed rest, physical therapy and limited physical activity.   You are encouraged for a close follow-up with your primary care provider for maintenance care.

## 2023-10-02 NOTE — ED Triage Notes (Signed)
 Per EMS patient is from [facility], facility reported patient had a fall on Saturday and today complaining of low back pain.

## 2023-10-02 NOTE — ED Notes (Signed)
 Pt not able to indicate where pain was located so lidocaine patch not applied. Provider notified.

## 2023-10-10 ENCOUNTER — Encounter: Payer: Self-pay | Admitting: Pulmonary Disease

## 2023-10-10 ENCOUNTER — Ambulatory Visit: Payer: Medicare Other | Admitting: Pulmonary Disease

## 2023-10-10 VITALS — BP 106/66 | HR 96 | Temp 97.6°F | Ht 62.0 in | Wt 82.6 lb

## 2023-10-10 DIAGNOSIS — R569 Unspecified convulsions: Secondary | ICD-10-CM | POA: Diagnosis not present

## 2023-10-10 DIAGNOSIS — R634 Abnormal weight loss: Secondary | ICD-10-CM

## 2023-10-10 DIAGNOSIS — F0390 Unspecified dementia without behavioral disturbance: Secondary | ICD-10-CM | POA: Diagnosis not present

## 2023-10-10 DIAGNOSIS — R9389 Abnormal findings on diagnostic imaging of other specified body structures: Secondary | ICD-10-CM | POA: Diagnosis not present

## 2023-10-10 NOTE — Patient Instructions (Signed)
 History of Present Illness Michele Stout is an 83 year old female who presents with an abnormal chest x-ray.  She underwent a chest x-ray during a recent emergency room visit, which revealed an abnormality. The x-ray was not prompted by any specific symptoms at the time.  There is a history of weight loss, although the duration and amount are unspecified. No cough, fever, or chills.  Assessment and Plan Abnormal Chest X-ray Abnormality detected on chest x-ray during recent ER visit. No associated symptoms such as cough, fever, or chills. Noted weight loss. Differential diagnosis includes possible pulmonary pathology. Discussed the need for a non-contrast CT of the chest to further evaluate the abnormality. Explained the non-invasive nature of the CT scan. - Order non-contrast CT of the chest - Schedule follow-up appointment post-CT results.

## 2023-10-10 NOTE — Progress Notes (Signed)
 Subjective:    Patient ID: Michele Stout, female    DOB: 1941/03/19, 83 y.o.   MRN: [id number]  Patient Care Team: Patient, No Pcp Per as PCP - General (General Practice)  Chief Complaint  Patient presents with   Consult    BACKGROUND/INTERVAL: Patient is an 83 year old non-smoker who presents for evaluation of an abnormal chest x-ray.  Patient has dementia and cannot provide history.  Presents with care facility employee.  HPI Discussed the use of AI scribe software for clinical note transcription with the patient's caretaker, who understood the process.  History of Present Illness   Michele Stout is an 83 year old female who presents with an abnormal chest x-ray.  She is nonverbal due to dementia.  Take her with her today cannot provide history.  She underwent a chest x-ray during a recent emergency room visit, which revealed an abnormality. The x-ray was not prompted by any specific symptoms at the time.  She was referred by the emergency room APP who evaluated the patient.  The visit was prompted by a fall in her care facility which is [facility].  There is a history of weight loss, although the duration and amount are unspecified. No cough, fever, or chills.  She has a history of seizures but none recently.  She is on Keppra for seizures.  She is on Aricept and Namenda for dementia shows no significant behavioral disturbance.    I placed a call to the facilities director who could not provide more detail.  No further history can be obtained.  Review of Systems A 10 point review of systems was performed and it is as noted above otherwise negative.   There are no active problems to display for this patient.   Social History   Tobacco Use   Smoking status: Unknown   Smokeless tobacco: Not on file  Substance Use Topics   Alcohol use: Not on file    Allergies  Allergen Reactions   Aspirin     Current Meds  Medication Sig   acetaminophen (TYLENOL) 500  MG tablet Take 2 tablets by mouth every 8 (eight) hours as needed for mild pain.    traMADol (ULTRAM) 50 MG tablet Take 25 mg by mouth in the morning, at noon, and at bedtime. As needed.    Immunization History  Administered Date(s) Administered   Influenza-Unspecified 05/11/2012      Objective:     BP 106/66 (BP Location: Right Arm, Patient Position: Sitting, Cuff Size: Normal)   Pulse 96   Temp 97.6 F (36.4 C) (Temporal)   Ht 5\' 2"  (1.575 m)   Wt 82 lb 9.6 oz (37.5 kg)   SpO2 97%   BMI 15.11 kg/m   SpO2: 97 %  GENERAL: Thin/cachectic woman, presents in transport chair.  Appears debilitated.  Nonverbal.  In no acute distress. HEAD: Normocephalic, atraumatic.  EYES: Pupils equal, round, reactive to light.  No scleral icterus.  MOUTH: Poor dentition, oral mucosa moist. NECK: Supple. No thyromegaly. Trachea midline. No JVD.  No adenopathy. PULMONARY: Good air entry bilaterally.  No adventitious sounds. CARDIOVASCULAR: S1 and S2. Regular rate and rhythm.  ABDOMEN: Scaphoid, otherwise benign. MUSCULOSKELETAL: No joint deformity, no clubbing, no edema.  NEUROLOGIC: Nonverbal.  Cannot make full assessment due to patient's debility. SKIN: Intact,warm,dry. PSYCH: Unable to assess.  Single AP view chest x-ray performed 02 October 2023 independently reviewed shows a masslike opacity on the left upper lung zone:  Assessment & Plan:     ICD-10-CM   1. Abnormal CXR (chest x-ray)  R93.89 CT CHEST WO CONTRAST    2. Weight loss  R63.4 CT CHEST WO CONTRAST    3. Seizures (HCC)  R56.9     4. Dementia without behavioral disturbance (HCC)  F03.90      Orders Placed This Encounter  Procedures   CT CHEST WO CONTRAST    Standing Status:   Future    Expected Date:   10/17/2023    Expiration Date:   10/09/2024    Preferred imaging location?:   [facility]   Discussion:    Abnormal Chest X-ray Abnormality detected on chest x-ray during recent ER visit. No associated  symptoms such as cough, fever, or chills. Noted weight loss. Differential diagnosis includes possible lung mass. Discussed the need for a non-contrast CT of the chest to further evaluate the abnormality. Explained the non-invasive nature of the CT scan. - Order non-contrast CT of the chest - Schedule follow-up appointment post-CT results.     Advised if symptoms do not improve or worsen, to please contact office for sooner follow up or seek emergency care.    I spent 45 minutes of dedicated to the care of this patient on the date of this encounter to include pre-visit review of records, face-to-face time with the patient discussing conditions above, post visit ordering of testing, clinical documentation with the electronic health record, making appropriate referrals as documented, and communicating necessary findings to members of the patients care team.   C. Danice Goltz, MD Advanced Bronchoscopy PCCM [facility]    *This note was generated using voice recognition software/Dragon and/or AI transcription program.  Despite best efforts to proofread, errors can occur which can change the meaning. Any transcriptional errors that result from this process are unintentional and may not be fully corrected at the time of dictation.

## 2023-10-14 ENCOUNTER — Encounter: Payer: Self-pay | Admitting: Pulmonary Disease

## 2023-10-17 ENCOUNTER — Ambulatory Visit
Admission: RE | Admit: 2023-10-17 | Discharge: 2023-10-17 | Disposition: A | Discharge status: Home / Self Care | Source: Ambulatory Visit | Attending: Pulmonary Disease | Admitting: Pulmonary Disease

## 2023-10-17 DIAGNOSIS — R9389 Abnormal findings on diagnostic imaging of other specified body structures: Secondary | ICD-10-CM | POA: Diagnosis present

## 2023-10-17 DIAGNOSIS — R634 Abnormal weight loss: Secondary | ICD-10-CM | POA: Insufficient documentation

## 2023-11-02 ENCOUNTER — Encounter: Payer: Self-pay | Admitting: Pulmonary Disease

## 2023-11-02 ENCOUNTER — Ambulatory Visit (INDEPENDENT_AMBULATORY_CARE_PROVIDER_SITE_OTHER): Admitting: Pulmonary Disease

## 2023-11-02 VITALS — BP 130/60 | HR 88 | Temp 97.6°F | Ht 62.0 in | Wt 86.6 lb

## 2023-11-02 DIAGNOSIS — R569 Unspecified convulsions: Secondary | ICD-10-CM

## 2023-11-02 DIAGNOSIS — R5381 Other malaise: Secondary | ICD-10-CM

## 2023-11-02 DIAGNOSIS — R918 Other nonspecific abnormal finding of lung field: Secondary | ICD-10-CM | POA: Diagnosis not present

## 2023-11-02 DIAGNOSIS — F0390 Unspecified dementia without behavioral disturbance: Secondary | ICD-10-CM

## 2023-11-02 DIAGNOSIS — C3490 Malignant neoplasm of unspecified part of unspecified bronchus or lung: Secondary | ICD-10-CM

## 2023-11-02 NOTE — Patient Instructions (Addendum)
 VISIT SUMMARY:  Today, we discussed the follow-up on your lung mass, which is consistent with lung cancer. The mass is large and cannot be operated on. We also reviewed your dementia, which affects your ability to communicate and make decisions about your care.  YOUR PLAN:  -LUNG CANCER: Lung cancer is a disease where cells in the lungs grow uncontrollably. Your lung cancer is large and cannot be operated on. Smoking has contributed to this condition. We will consider hospice care to help manage your symptoms and coordinate with palliative care services. We will also contact your next of kin, Taler Kushner, to discuss care options. A PET scan may be considered to identify biopsy sites, but general anesthesia is not feasible for you.  -DEMENTIA: Dementia is a condition that affects your memory and ability to think clearly. It complicates the management of your lung cancer and limits treatment options. We will coordinate with the nurse practitioner and staff at your facility to ensure you receive comprehensive care for your dementia and related needs.  INSTRUCTIONS:  We will continue to contact your next of kin, Jovanka Westgate, to discuss your care options. Please continue to work with the nurse practitioner and staff at your facility for your ongoing care needs. We will see you in follow up in 3-4 weeks time.

## 2023-11-02 NOTE — Progress Notes (Signed)
 Subjective:    Patient ID: Michele Stout, female    DOB: Jan 07, 1941, 83 y.o.   MRN: [id number]  Patient Care Team: Michele Houseman, NP as PCP - General (Family Medicine) [facility]hele Stout Michele Shanks, NP as Nurse Practitioner (Family Medicine)  Chief Complaint  Patient presents with   Follow-up    BACKGROUND/INTERVAL: Patient is an 83 year old current smoker who presents for follow up of an abnormal chest x-ray.  Patient has dementia and cannot provide history.  Presents with care facility employee.   HPI Discussed the use of AI scribe software for clinical note transcription with the patient, who gave verbal consent to proceed.  History of Present Illness   Michele Stout is an 83 year old female with dementia who presents for follow-up on a lung mass.  She has been diagnosed with lung cancer, confirmed by a CT scan, which is described as very large and inoperable. The timeline of the lung mass is uncertain, but it is suspected to have been present for approximately six months. There was a previous report of weight loss, but her weight has been stable since the last visit, with a slight increase noted. No cough or wheezing is reported.  She is nonverbal and has significant hearing impairment, making communication challenging. Due to her underlying dementia, she cannot provide consent for treatment, and her hearing impairment complicates communication further.  She has a history of smoking, currently smoking about three cigarettes a day, primarily after meals. In the past, her smoking was more frequent, but it is now controlled at her facility.   I was able to communicate with her care provider Michele Iha, NP [phone]), after discussion with Michele Stout it was noted that the patient has been in general state of decline for months now.  She is very frail and debilitated, unable to understand her current circumstances and unable to provide consent.  Palliative care had been considered and given  the current findings NP Michele Stout will consult palliative care team for transitioning patient to hospice.    Review of Systems A 10 point review of systems was performed and it is as noted above otherwise negative.   There are no active problems to display for this patient.   Social History   Tobacco Use   Smoking status: Unknown   Smokeless tobacco: Not on file  Substance Use Topics   Alcohol use: Not on file    Allergies  Allergen Reactions   Aspirin     Current Meds  Medication Sig   acetaminophen (TYLENOL) 500 MG tablet Take 2 tablets by mouth every 8 (eight) hours as needed for mild pain.    memantine (NAMENDA) 10 MG tablet Take 1 tablet by mouth at bedtime.   omeprazole (PRILOSEC) 20 MG capsule Take 1 capsule by mouth daily.   sertraline (ZOLOFT) 50 MG tablet Take 1 tablet by mouth daily.   traMADol (ULTRAM) 50 MG tablet Take 25 mg by mouth in the morning, at noon, and at bedtime. As needed.   venlafaxine (EFFEXOR) 75 MG tablet Take 1 tablet by mouth 2 (two) times daily.    Immunization History  Administered Date(s) Administered   Influenza-Unspecified 05/11/2012        Objective:     BP 130/60 (BP Location: Left Arm, Patient Position: Sitting, Cuff Size: Normal)   Pulse 88   Temp 97.6 F (36.4 C) (Temporal)   Ht 5\' 2"  (1.575 m)   Wt 86 lb 9.6 oz (39.3 kg)  SpO2 98%   BMI 15.84 kg/m   SpO2: 98 %  GENERAL: Thin/cachectic woman, ambulatory.  Appears very debilitated.  Very hard of hearing, nonverbal.  In no acute distress. HEAD: Normocephalic, atraumatic.  EYES: Pupils equal, round, reactive to light.  No scleral icterus.  MOUTH: Poor dentition, oral mucosa moist. NECK: Supple. No thyromegaly. Trachea midline. No JVD.  No adenopathy. PULMONARY: Good air entry bilaterally.  No adventitious sounds. CARDIOVASCULAR: S1 and S2. Regular rate and rhythm.  ABDOMEN: Scaphoid, otherwise benign. MUSCULOSKELETAL: No joint deformity, no clubbing, no edema.   NEUROLOGIC: Nonverbal.  Very hard of hearing. SKIN: Intact,warm,dry. PSYCH: Unable to assess.   Assessment & Plan:     ICD-10-CM   1. Lung mass  R91.8     2. Malignant neoplasm of unspecified part of unspecified bronchus or lung (HCC)  C34.90 CANCELED: NM PET Image Initial (PI) Skull Base To Thigh (F-18 FDG)    3. Seizures (HCC)  R56.9     4. Dementia without behavioral disturbance (HCC)  F03.90     5. Physical debility  R53.81      Discussion:    Lung cancer Lung cancer confirmed by CT scan, presenting as a large, inoperable mass likely present for approximately six months. Smoking is a contributing factor. Underlying dementia and nonverbal status with severe hearing impairment complicate communication and consent, limiting treatment options. General anesthesia is not commended due to the patient's overall debilitated status, lack of orientation to situation, inability to consent to procedures, affecting the potential for treatment.  PET/CT contemplated however after discussion with patient's primary care provider transitioned to palliative care/hospice will be initiated. - Recommend hospice care for palliative management. - Coordinate with palliative care services to manage symptoms and care needs. - Contact next of kin, Michele Stout, to discuss care options. - Consider a PET scan to identify potential biopsy sites, although she cannot undergo general anesthesia -was considered but best course of care for this patient would be palliative/hospice..  Dementia Dementia affects her ability to communicate and provide consent, complicating lung cancer management and limiting treatment options. - Coordinate care with the facility's nurse practitioner and staff to ensure comprehensive management of dementia and associated care needs. - Cancel PET/CT, cancel follow-up appointment however happy to see the patient again on an as-needed basis.    Advised if symptoms do not improve or  worsen, to please contact office for sooner follow up or seek emergency care.    I spent 40 minutes of dedicated to the care of this patient on the date of this encounter to include pre-visit review of records, face-to-face time with the patient discussing conditions above, post visit ordering of testing, clinical documentation with the electronic health record, making appropriate referrals as documented, and communicating necessary findings to members of the patients care team.     C. Danice Goltz, MD Advanced Bronchoscopy PCCM [place] [facility]    *This note was generated using voice recognition software/Dragon and/or AI transcription program.  Despite best efforts to proofread, errors can occur which can change the meaning. Any transcriptional errors that result from this process are unintentional and may not be fully corrected at the time of dictation.

## 2023-11-07 ENCOUNTER — Ambulatory Visit

## 2024-01-09 ENCOUNTER — Encounter: Payer: Self-pay | Admitting: Pulmonary Disease

## 2024-01-09 ENCOUNTER — Ambulatory Visit (INDEPENDENT_AMBULATORY_CARE_PROVIDER_SITE_OTHER): Admitting: Pulmonary Disease

## 2024-01-09 VITALS — BP 118/56 | HR 69 | Temp 97.1°F | Ht 62.0 in | Wt 88.4 lb

## 2024-01-09 DIAGNOSIS — F0390 Unspecified dementia without behavioral disturbance: Secondary | ICD-10-CM | POA: Diagnosis not present

## 2024-01-09 DIAGNOSIS — R5381 Other malaise: Secondary | ICD-10-CM | POA: Diagnosis not present

## 2024-01-09 DIAGNOSIS — F17210 Nicotine dependence, cigarettes, uncomplicated: Secondary | ICD-10-CM

## 2024-01-09 DIAGNOSIS — R918 Other nonspecific abnormal finding of lung field: Secondary | ICD-10-CM | POA: Diagnosis not present

## 2024-01-09 DIAGNOSIS — C3490 Malignant neoplasm of unspecified part of unspecified bronchus or lung: Secondary | ICD-10-CM | POA: Insufficient documentation

## 2024-01-09 NOTE — Patient Instructions (Signed)
 VISIT SUMMARY:  Today, we discussed your advanced lung cancer and the focus on maintaining your quality of life. We also addressed your itchy, watery eyes and provided recommendations for managing these symptoms.  YOUR PLAN:  -LUNG CANCER: You have advanced lung cancer, which means the cancer has progressed significantly. Due to the potential risks of treatment, we are focusing on hospice and palliative care to ensure your comfort and quality of life. Continue with hospice care, and engage in activities you enjoy, such as fishing and smoking, as they do not pose any harm at this stage.  -ITCHY, WATERY EYES: Your persistent itchy and watery eyes are likely due to allergies. You can take over-the-counter allergy medications like Claritin or Zyrtec to help manage these symptoms.  INSTRUCTIONS:  Please continue with your current hospice and palliative care plan. If you have any new symptoms or concerns, contact your healthcare provider. For your itchy, watery eyes, try using over-the-counter allergy medications like Claritin or Zyrtec as needed.

## 2024-01-09 NOTE — Progress Notes (Signed)
 Subjective:    Patient ID: Michele Stout, female    DOB: 06-23-1941, 83 y.o.   MRN: [id number]  Patient Care Team: Janann Meadow, NP as PCP - [facility])  Chief Complaint  Patient presents with   Follow-up    Lung mass. Care giver says she does not have a cough or wheezing.     BACKGROUND/INTERVAL: Patient is an 83 year old current smoker who presents for follow up of an abnormal chest x-ray.  Patient has dementia and cannot provide history.  Presents with care facility employee.  Daughter also presents with the patient today.  HPI HPIDiscussed the use of AI scribe software for clinical note transcription with the patient, who gave verbal consent to proceed.  History of Present Illness   Michele Stout is an 83 year old female with advanced lung cancer who presents for follow-up of a lung mass. She is accompanied by her daughter, who is also her primary caregiver.  She has advanced lung cancer and is currently under hospice care, with a nurse practitioner visiting every Thursday. Her daughter was informed about the lung mass and its advanced stage during a previous visit at the end of March 2025. A CT scan was performed after a spot was found, as relayed by the nurse practitioner.  She had a hip fracture, which has since improved. She continues to smoke cigarettes, and there is an emphasis on maintaining her quality of life. Her appetite remains good, and she enjoys eating a variety of foods. She also has a history of enjoying outdoor activities like fishing.  She experiences persistent itchy, watery eyes, for which she is currently not on allergy medication. Her daughter confirms that she is not in pain and describes her as 'strong'. She sleeps well and is described as having a routine that includes smoking outside.   Patient is not a candidate for invasive procedures due to advanced age/frailty, dementia, inability to participate in plan of care.    Review of  Systems A 10 point review of systems was performed and it is as noted above otherwise negative.   Patient Active Problem List   Diagnosis Date Noted   Lung mass 01/09/2024   Malignant neoplasm of unspecified part of unspecified bronchus or lung (HCC) 01/09/2024   Dementia without behavioral disturbance (HCC) 01/09/2024   Physical debility 01/09/2024    Social History   Tobacco Use   Smoking status: Every Day    Types: Cigarettes   Smokeless tobacco: Not on file   Tobacco comments:    Cannot quantitate amount smoked.  Substance Use Topics   Alcohol use: Not on file    Allergies  Allergen Reactions   Aspirin     Current Meds  Medication Sig   acetaminophen  (TYLENOL ) 500 MG tablet Take 2 tablets by mouth every 8 (eight) hours as needed for mild pain.    alendronate (FOSAMAX) 70 MG tablet Take 70 mg by mouth once a week.   amLODipine (NORVASC) 5 MG tablet Take 5 mg by mouth daily.   donepezil (ARICEPT) 10 MG tablet Take 10 mg by mouth daily.   lactose free nutrition (BOOST) LIQD Take 237 mLs by mouth daily.   levETIRAcetam (KEPPRA) 500 MG tablet Take 500 mg by mouth 2 (two) times daily.   memantine (NAMENDA) 10 MG tablet Take 1 tablet by mouth at bedtime.   omeprazole (PRILOSEC) 20 MG capsule Take 1 capsule by mouth daily.   sertraline (ZOLOFT) 50 MG tablet Take  1 tablet by mouth daily.   traMADol (ULTRAM) 50 MG tablet Take 25 mg by mouth in the morning, at noon, and at bedtime. As needed.   venlafaxine (EFFEXOR) 75 MG tablet Take 1 tablet by mouth 2 (two) times daily.    Immunization History  Administered Date(s) Administered   Influenza-Unspecified 05/11/2012        Objective:     BP (!) 118/56 (BP Location: Right Arm, Cuff Size: Normal)   Pulse 69   Temp (!) 97.1 F (36.2 C)   Ht 5\' 2"  (1.575 m)   Wt 88 lb 6.4 oz (40.1 kg)   SpO2 95%   BMI 16.17 kg/m   SpO2: 95 % O2 Device: None (Room air)  GENERAL: Thin/cachectic woman, ambulatory.  Appears very  debilitated.  Very hard of hearing, nonverbal.  In no acute distress. HEAD: Normocephalic, atraumatic.  EYES: Pupils equal, round, reactive to light.  No scleral icterus.  MOUTH: Poor dentition, oral mucosa moist. NECK: Supple. No thyromegaly. Trachea midline. No JVD.  No adenopathy. PULMONARY: Good air entry bilaterally.  No adventitious sounds. CARDIOVASCULAR: S1 and S2. Regular rate and rhythm.  ABDOMEN: Scaphoid, otherwise benign. MUSCULOSKELETAL: No joint deformity, no clubbing, no edema.  NEUROLOGIC: Nonverbal.  Very hard of hearing. SKIN: Intact,warm,dry. PSYCH: Unable to assess.   Assessment & Plan:     ICD-10-CM   1. Lung mass  R91.8     2. Malignant neoplasm of unspecified part of unspecified bronchus or lung (HCC)  C34.90     3. Dementia without behavioral disturbance (HCC)  F03.90     4. Physical debility  R53.81      Discussion:    Lung cancer Advanced lung cancer with significant progression. She is not a candidate for treatment due to potential cachexia, generalized frailty and debility, dementia and inability to participate in care decisions.  Invasive procedures/chemotherapy with increased morbidity/mortality.  Hospice and palliative care are in place to focus on comfort and quality of life. She is currently without significant respiratory distress. - Continue hospice and palliative care to maintain quality of life. - Encourage participation in enjoyable activities, such as fishing and smoking, as they pose no harm at this stage. - Prioritize comfort measures.  Itchy, watery eyes Persistent symptoms likely due to allergies. She may benefit from allergy medication. - Recommend over-the-counter allergy medication such as Claritin or Zyrtec.     Advised if symptoms do not improve or worsen, to please contact office for sooner follow up or seek emergency care.    I spent 32 minutes of dedicated to the care of this patient on the date of this encounter to include  pre-visit review of records, face-to-face time with the patient discussing conditions above, post visit ordering of testing, clinical documentation with the electronic health record, making appropriate referrals as documented, and communicating necessary findings to members of the patients care team.     C. Chloe Counter, MD Advanced Bronchoscopy [facility]    *This note was generated using voice recognition software/Dragon and/or AI transcription program.  Despite best efforts to proofread, errors can occur which can change the meaning. Any transcriptional errors that result from this process are unintentional and may not be fully corrected at the time of dictation.

## 2024-04-24 ENCOUNTER — Emergency Department

## 2024-04-24 ENCOUNTER — Emergency Department
Admission: EM | Admit: 2024-04-24 | Discharge: 2024-04-24 | Disposition: A | Discharge status: Home / Self Care | Attending: Emergency Medicine | Admitting: Emergency Medicine

## 2024-04-24 ENCOUNTER — Other Ambulatory Visit: Payer: Self-pay

## 2024-04-24 ENCOUNTER — Encounter: Payer: Self-pay | Admitting: Emergency Medicine

## 2024-04-24 DIAGNOSIS — F0390 Unspecified dementia without behavioral disturbance: Secondary | ICD-10-CM | POA: Insufficient documentation

## 2024-04-24 DIAGNOSIS — M79641 Pain in right hand: Secondary | ICD-10-CM | POA: Insufficient documentation

## 2024-04-24 DIAGNOSIS — M25531 Pain in right wrist: Secondary | ICD-10-CM | POA: Insufficient documentation

## 2024-04-24 NOTE — ED Provider Notes (Signed)
 [facility] Provider Note    Event Date/Time   First MD Initiated Contact with Patient 04/24/24 [phone]     (approximate)   History   Fall and Wrist Pain   HPI  Michele Stout is a 83 y.o. female with a history of dementia and a lung mass who presents with swelling and pain to the right wrist and hand.  The patient is unable to give any history; she just states it hurts when I touch the wrist.  Per EMS, the patient had no symptoms last night at her facility.  She was found by staff this morning with the swelling and pain to the right wrist.  It is unknown if she fell.  I reviewed the past medical records; the patient's most recent outpatient encounter was with Dr. Tamea from pulmonary on 6/3 for follow-up of an abnormal chest x-ray.  Physical Exam   Triage Vital Signs: ED Triage Vitals [04/24/24 0637]  Encounter Vitals Group     BP (!) 131/58     Girls Systolic BP Percentile      Girls Diastolic BP Percentile      Boys Systolic BP Percentile      Boys Diastolic BP Percentile      Pulse Rate 69     Resp 18     Temp 98.5 F (36.9 C)     Temp Source Oral     SpO2 92 %     Weight 98 lb 6.4 oz (44.6 kg)     Height 5' 2 (1.575 m)     Head Circumference      Peak Flow      Pain Score      Pain Loc      Pain Education      Exclude from Growth Chart     Most recent vital signs: Vitals:   04/24/24 0637  BP: (!) 131/58  Pulse: 69  Resp: 18  Temp: 98.5 F (36.9 C)  SpO2: 92%     General: Alert, comfortable appearing, no distress.  CV:  Good peripheral perfusion.  Resp:  Normal effort.  Abd:  No distention.  Other:  Right wrist and proximal dorsal hand with swelling, slight erythema, tenderness to touch.  The patient is able to range the wrist.  Motor function intact in the median, radial, and ulnar distributions.  Normal cap refill distally.  EOMI.  PERRLA.  Motor intact in all extremities.  Normal speech.  No midline spinal tenderness.   No other visible trauma.   ED Results / Procedures / Treatments   Labs (all labs ordered are listed, but only abnormal results are displayed) Labs Reviewed - No data to display   EKG  ED ECG REPORT I, Waylon Cassis, the attending physician, personally viewed and interpreted this ECG.  Date: 04/24/2024 EKG Time: 0642 Rate: 64 Rhythm: normal sinus rhythm QRS Axis: Left axis Intervals: normal ST/T Wave abnormalities: Repolarization abnormality Narrative Interpretation: no evidence of acute ischemia    RADIOLOGY  XR R wrist: I independently viewed and interpreted the images; there is no fracture or dislocation  PROCEDURES:  Critical Care performed: No  Procedures   MEDICATIONS ORDERED IN ED: Medications - No data to display   IMPRESSION / MDM / ASSESSMENT AND PLAN / ED COURSE  I reviewed the triage vital signs and the nursing notes.  83 year old female with PMH as noted above presents after she was found this morning by staff at her facility with swelling  and pain to the right wrist.  Although it deformity was noted in triage, the wrist is not really deformed but just swollen.  The patient is actually able to range it somewhat, but with pain.  The right hand is neuro/vascular intact.  The patient has no midline spinal tenderness and her neurologic exam is nonfocal.  There is no evidence of head injury or other acute trauma.  Differential diagnosis includes, but is not limited to, contusion, nondisplaced fracture, versus possible nontraumatic etiology such as gout.  X-rays of the right wrist are pending.  There is no indication for additional imaging or workup at this time.  Patient's presentation is most consistent with acute complicated illness / injury requiring diagnostic workup.  ----------------------------------------- 8:10 AM on 04/24/2024 -----------------------------------------  X-rays are negative for acute fracture.  There is just soft tissue  swelling.  We have put the patient in a wrist brace for comfort.  She is stable for discharge back to her facility.  Return precautions have been provided.   FINAL CLINICAL IMPRESSION(S) / ED DIAGNOSES   Final diagnoses:  Right wrist pain     Rx / DC Orders   ED Discharge Orders     None        Note:  This document was prepared using Dragon voice recognition software and may include unintentional dictation errors.    Jacolyn Pae, MD 04/24/24 [phone]

## 2024-04-24 NOTE — ED Triage Notes (Signed)
 BIB ems from [facility], with obvious deformity to right wrist.  Unsure if pt fell, per ems facility said she was okay during the night and when they checked on her this morning her wrist was swollen and painful to touch.  Pt has hx of dementia is at her baseline and hard of hearing

## 2024-04-24 NOTE — Discharge Instructions (Addendum)
 Michele Stout had x-rays done of her right wrist.  There is no sign of a fracture.  It is possible that she has a contusion from a fall, versus inflammation from conditions such as gout.  We have put her in a wrist brace for comfort.  She may take Tylenol  for the pain.  Ice pack can be applied to help with the swelling.  She should return to the emergency department for any new or worsening pain, swelling, inability to move the wrist or arm, redness or swelling spreading up the arm, fever, or any other new or worsening symptoms that are concerning.
